# Patient Record
Sex: Female | Born: 1940 | Race: White | Hispanic: No | Marital: Married | Smoking: Never smoker
Health system: Southern US, Community
[De-identification: ages and names within clinical notes are randomized; demographics above are authoritative.]

## PROBLEM LIST (undated history)

## (undated) DIAGNOSIS — I639 Cerebral infarction, unspecified: Secondary | ICD-10-CM

## (undated) HISTORY — DX: Cerebral infarction, unspecified: I63.9

---

## 1999-01-16 ENCOUNTER — Other Ambulatory Visit: Admission: RE | Admit: 1999-01-16 | Discharge: 1999-01-16 | Payer: Self-pay | Admitting: *Deleted

## 2000-02-15 ENCOUNTER — Other Ambulatory Visit: Admission: RE | Admit: 2000-02-15 | Discharge: 2000-02-15 | Payer: Self-pay | Admitting: *Deleted

## 2002-06-19 ENCOUNTER — Inpatient Hospital Stay (HOSPITAL_COMMUNITY): Admission: EM | Admit: 2002-06-19 | Discharge: 2002-06-29 | Payer: Self-pay

## 2002-06-20 ENCOUNTER — Encounter: Payer: Self-pay | Admitting: Neurology

## 2002-06-21 ENCOUNTER — Encounter: Payer: Self-pay | Admitting: Neurology

## 2002-06-23 ENCOUNTER — Encounter: Payer: Self-pay | Admitting: Neurology

## 2002-06-29 ENCOUNTER — Inpatient Hospital Stay (HOSPITAL_COMMUNITY)
Admission: RE | Admit: 2002-06-29 | Discharge: 2002-08-03 | Payer: Self-pay | Admitting: Physical Medicine & Rehabilitation

## 2002-07-06 ENCOUNTER — Encounter: Payer: Self-pay | Admitting: Physical Medicine & Rehabilitation

## 2002-07-19 ENCOUNTER — Encounter: Payer: Self-pay | Admitting: Physical Medicine & Rehabilitation

## 2002-07-27 ENCOUNTER — Encounter: Payer: Self-pay | Admitting: Physical Medicine & Rehabilitation

## 2002-09-17 ENCOUNTER — Encounter: Payer: Self-pay | Admitting: Neurology

## 2002-09-17 ENCOUNTER — Ambulatory Visit (HOSPITAL_COMMUNITY): Admission: RE | Admit: 2002-09-17 | Discharge: 2002-09-17 | Payer: Self-pay | Admitting: Neurology

## 2002-11-10 ENCOUNTER — Encounter
Admission: RE | Admit: 2002-11-10 | Discharge: 2002-12-05 | Payer: Self-pay | Admitting: Physical Medicine & Rehabilitation

## 2002-12-06 ENCOUNTER — Encounter
Admission: RE | Admit: 2002-12-06 | Discharge: 2003-03-06 | Payer: Self-pay | Admitting: Physical Medicine & Rehabilitation

## 2002-12-21 ENCOUNTER — Encounter
Admission: RE | Admit: 2002-12-21 | Discharge: 2003-03-21 | Payer: Self-pay | Admitting: Physical Medicine & Rehabilitation

## 2003-10-03 ENCOUNTER — Encounter
Admission: RE | Admit: 2003-10-03 | Discharge: 2004-01-01 | Payer: Self-pay | Admitting: Physical Medicine & Rehabilitation

## 2003-11-07 ENCOUNTER — Encounter
Admission: RE | Admit: 2003-11-07 | Discharge: 2004-02-05 | Payer: Self-pay | Admitting: Physical Medicine & Rehabilitation

## 2004-02-06 ENCOUNTER — Encounter
Admission: RE | Admit: 2004-02-06 | Discharge: 2004-02-14 | Payer: Self-pay | Admitting: Physical Medicine & Rehabilitation

## 2004-09-22 IMAGING — XA IR ANGIO/CAROTID/CERV BI
1 series · 12 of 24 positions shown · IV contrast (omnipaque)
Comparison: none

FINDINGS
CLINICAL DATA: PATIENT WITH LEFT-SIDED CEREBRAL HEMORRHAGE.  ETIOLOGY UNKNOWN. EVALUATING FOR
ARTERIAL VENOUS MALFORMATION.
CAROTID AND CEREBRAL ARTERIOGRAMS
FOLLOWING A FULL EXPLANATION OF THE PROCEDURE, ALONG WITH THE POTENTIAL ASSOCIATED COMPLICATIONS,
AN INFORMED WITNESSED CONSENT WAS OBTAINED.
THE RIGHT GROIN WAS PREPPED AND DRAPED IN THE USUAL STERILE FASHION.  THEREAFTER USING THE MODIFIED
SELDINGER TECHNIQUE, TRANSFEMORAL ACCESS INTO THE RIGHT COMMON FEMORAL ARTERY WAS OBTAINED WITHOUT
ANY DIFFICULTY.  OVER A .035 INCH GUIDEWIRE, A 5 FRENCH PINNACLE SHEATH WAS INSERTED.
THROUGH THIS, AND ALSO OVER A .035 INCH GUIDEWIRE, A 5 FRENCH JB-I CATHETER WAS ADVANCED INTO THE
AORTIC ARCH REGION AND SELECTIVE CANNULATION ARTERIOGRAMS WERE PERFORMED OF THE RIGHT VERTEBRAL
ARTERY, THE RIGHT COMMON CAROTID  ARTERY, THE LEFT COMMON CAROTID ARTERY AND THE VERTEBRAL BODY.
THE PATIENT EXPERIENCED CRAMPING IN HER LEFT LEG TOWARDS THE OF THE PROCEDURE WHICH WAS TREATED
SUCCESSFULLY WITH 4 MG OF VALIUM IV.
MEDICATIONS UTILIZED:  VERSED 1 MG IV; FENTANYL 25 MCG IV.
CONTRAST UTILIZED:  OMNIPAQUE 300, APPROXIMATELY 80 CC.

[Series 1: run · 12 of 45 slices shown]
[im 2/45]
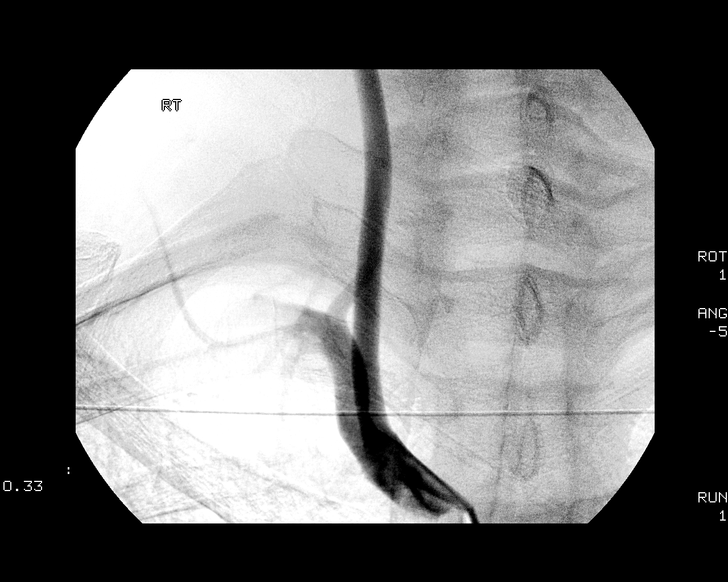
[im 6/45]
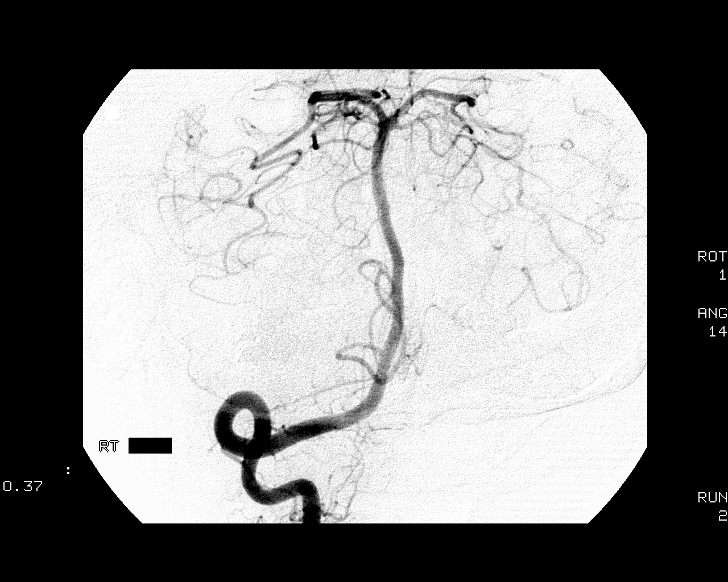
[im 10/45]
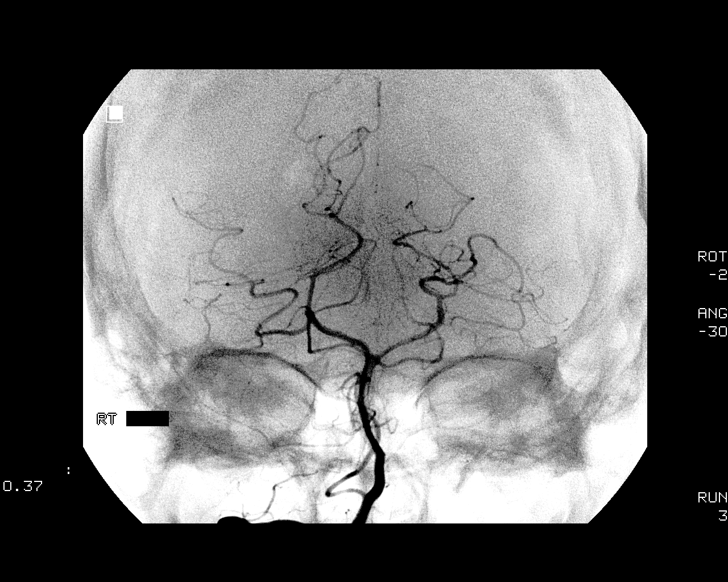
[im 14/45]
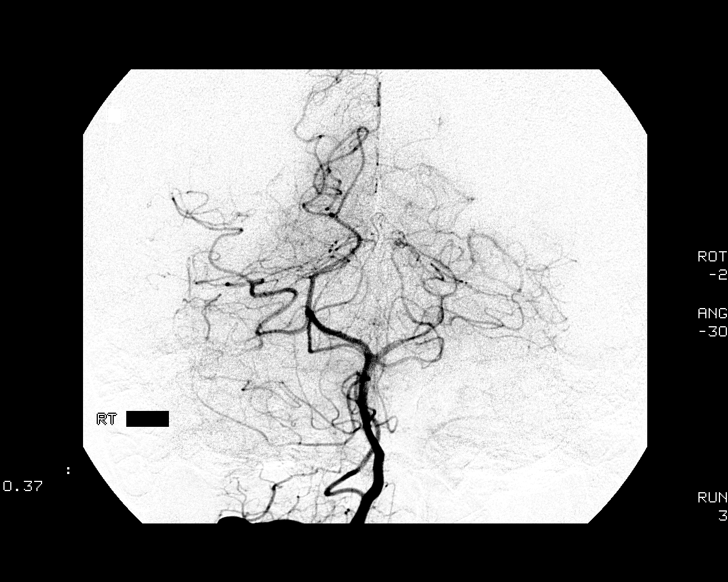
[im 18/45]
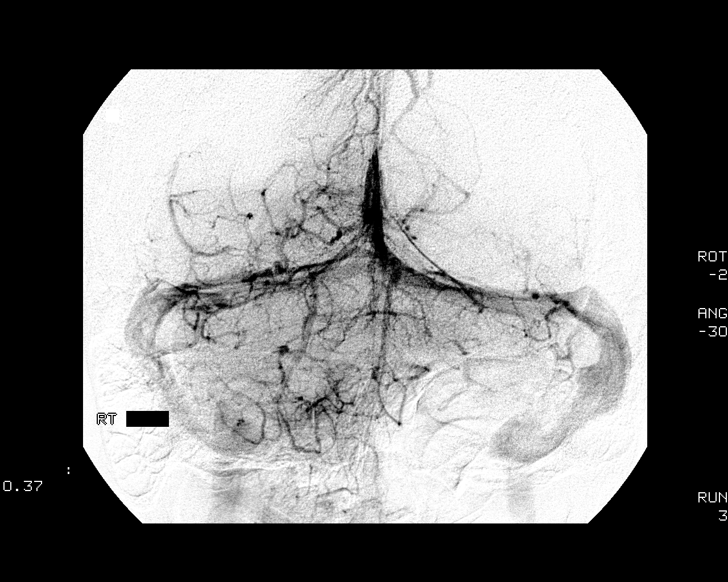
[im 22/45]
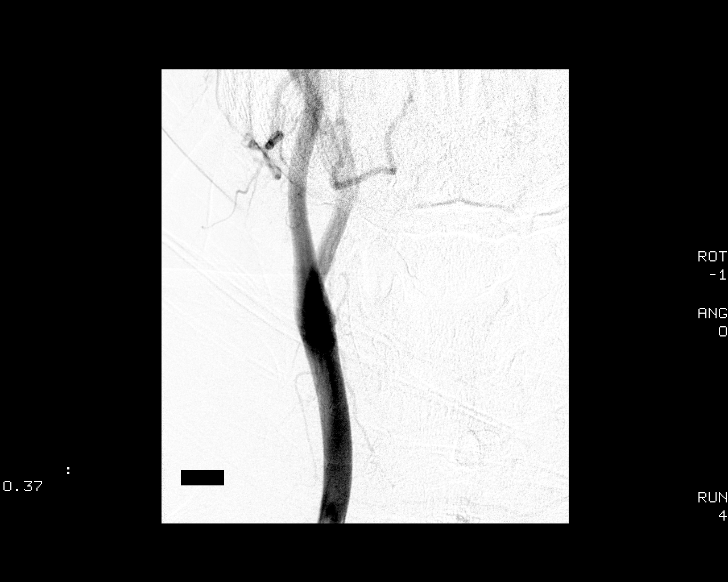
[im 25/45]
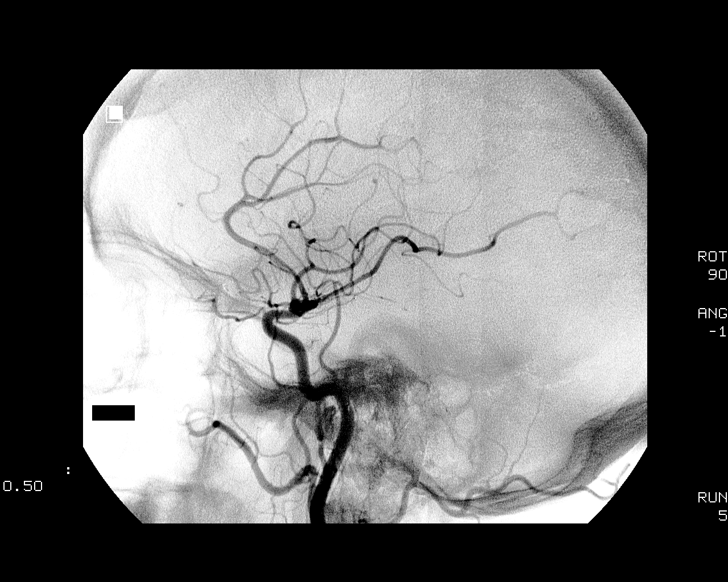
[im 29/45]
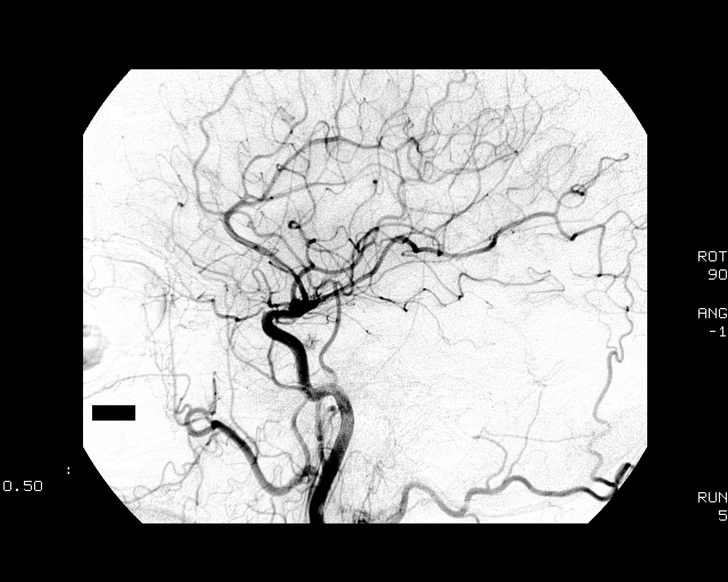
[im 33/45]
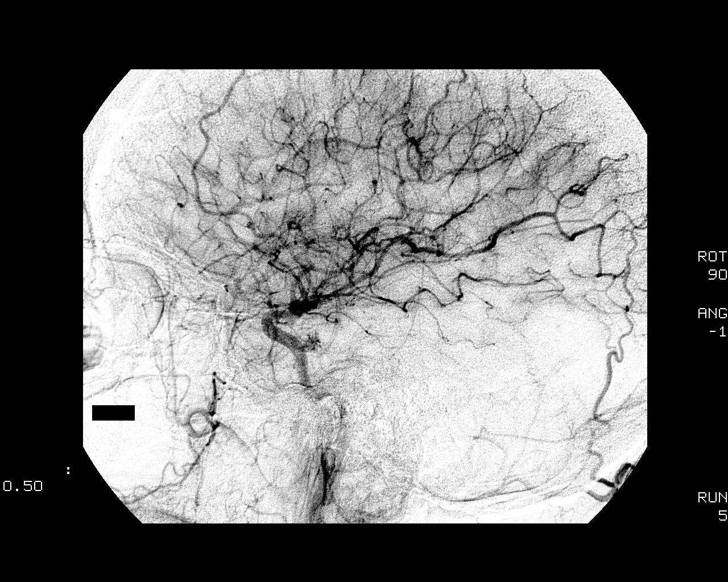
[im 37/45]
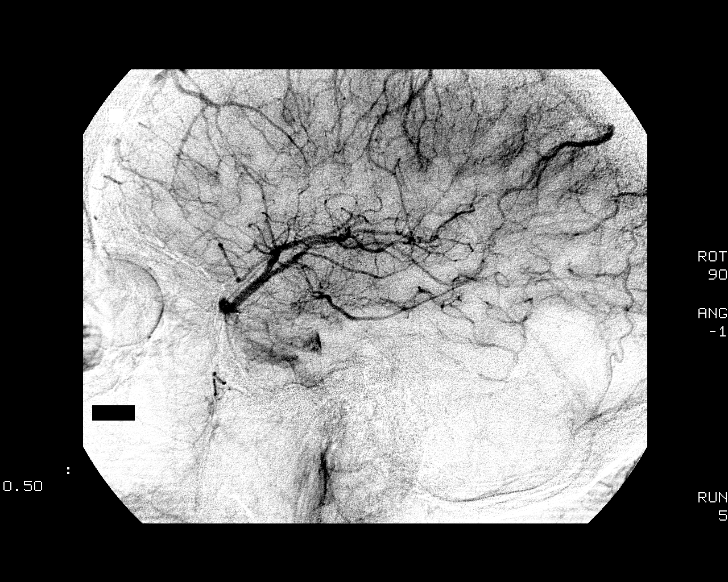
[im 41/45]
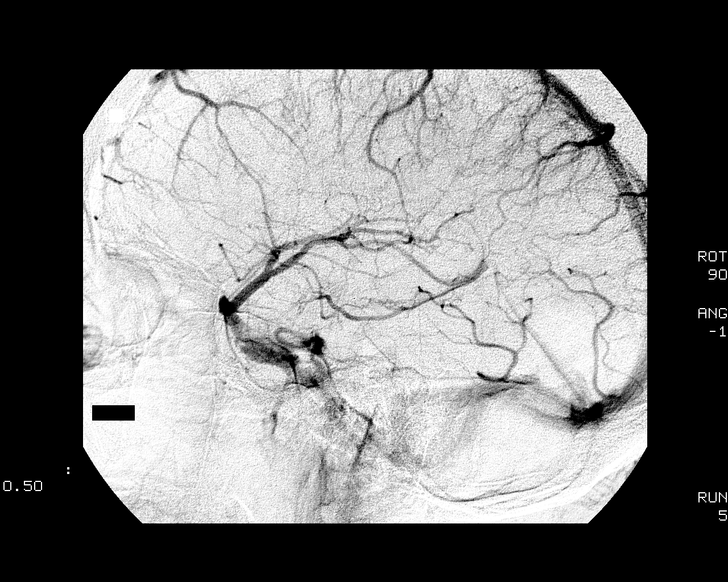
[im 45/45]
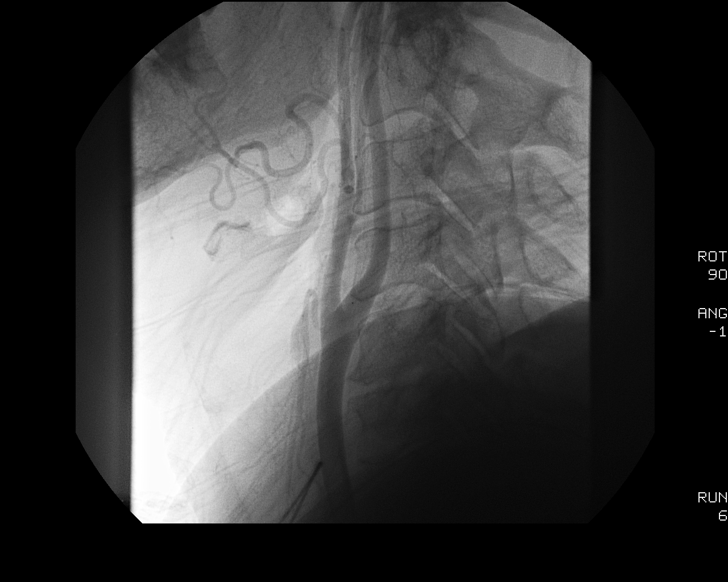

[12 of 24 positions shown; findings below may reference images not displayed]

FINDINGS: THE RIGHT VERTEBRAL  ARTERY  ORIGIN IS NORMAL.  THIS VESSEL ASCENDS NORMALLY INTO THE
CRANIAL SKULL BASE.  THERE IS NORMAL OPACIFICATION OF THE RIGHT POSTERIOR  INFERIOR CEREBELLAR
ARTERY, THE BASILAR ARTERY, THE POSTERIOR CEREBELLAR ARTERIES AND THE ANTERIOR/INFERIOR CEREBELLAR
ARTERIES INTO THE CAPILLARY AND VENOUS PHASES.
THE BASILAR ARTERY THAT OPACIFIES IS UNREMARKABLE.  THERE IS INFLOW OF UNOPACIFIED BLOOD FROM THE
CONTRALATERAL VERTEBRAL ARTERY IN THE BASILAR ARTERY.
THE RIGHT COMMON CAROTID ARTERIOGRAM  DEMONSTRATES THE RIGHT EXTERNAL CAROTID ARTERY AND BRANCHES
TO BE NORMAL.
THE RIGHT INTERNAL CAROTID ARTERY AT THE BULB END DISTALLY IS NORMAL.   THE RIGHT MIDDLE AND THE
RIGHT  ANTERIOR CEREBRAL ARTERIES OPACIFY NORMALLY INTO THE CAPILLARY AND VENOUS PHASES.
THERE IS  SLIGHT ELEVATION SUPERIORLY OF THE RIGHT M1 SEGMENT.
THE LEFT COMMON CAROTID ARTERIOGRAM DEMONSTRATES THE LEFT EXTERNAL CAROTID ARTERY ORIGIN AND
BRANCHES TO BE NORMAL.
THE LEFT INTERNAL CAROTID ARTERY AT THE BULB END DISTALLY IS NORMAL.  THE PETROUS, CAVERNOUS AND
THE  SUPRACLINOID SEGMENTS ARE NORMAL.
THE LEFT MIDDLE AND THE LEFT ANTERIOR CEREBRAL ARTERIES OPACIFY NORMALLY INTO THE CAPILLARY AND
VENOUS PHASES.
THE LEFT VERTEBRAL ARTERY ORIGIN IS NORMAL.  THE VESSEL ALSO ASCENDS NORMALLY INTO THE CRANIAL
SKULL BASE WHERE THERE IS NORMAL OPACIFICATION OF THE LEFT POSTERIOR INFERIOR CEREBELLAR ARTERY.
THE LEFT VERTEBRAL BASILAR JUNCTION AND THE OPACIFIED SEGMENTS OF THE BASILAR ARTERY ARE NORMAL.
ALSO THE OPACIFIED SEGMENTS OF THE POSTERIOR CEREBELLAR ARTERIES, THE SUPERIOR CEREBELLAR ARTERIES
AND THE ANTERIOR/INFERIOR CEREBELLAR ARTERIES ARE UNREMARKABLE.
IMPRESSION
NO ANGIOGRAPHIC EVIDENCE OF ARTERIAL VENOUS MALFORMATION, ANEURYSMS, INTRACRANIAL AND EXTRACRANIAL
DISSECTIONS, OCCLUSIONS, STENOSIS OR MEDIAL OR HYPERVASCULARITY OR DURAL ARTERIAL VENOUS
MALFORMATIONS.
ELEVATION WITH SUPERIOR DISPLACEMENT OF THE RIGHT A1 SEGMENT OF UNKNOWN SIGNIFICANCE. CORRELATION
WITH MRI SCAN MAY BE HELPFUL.

## 2005-02-12 ENCOUNTER — Other Ambulatory Visit: Admission: RE | Admit: 2005-02-12 | Discharge: 2005-02-12 | Payer: Self-pay | Admitting: *Deleted

## 2005-03-21 ENCOUNTER — Encounter: Admission: RE | Admit: 2005-03-21 | Discharge: 2005-04-30 | Payer: Self-pay | Admitting: *Deleted

## 2005-05-01 ENCOUNTER — Encounter: Admission: RE | Admit: 2005-05-01 | Discharge: 2005-05-29 | Payer: Self-pay | Admitting: *Deleted

## 2006-08-21 ENCOUNTER — Other Ambulatory Visit: Admission: RE | Admit: 2006-08-21 | Discharge: 2006-08-21 | Payer: Self-pay | Admitting: *Deleted

## 2007-08-28 ENCOUNTER — Encounter: Admission: RE | Admit: 2007-08-28 | Discharge: 2007-10-22 | Payer: Self-pay | Admitting: Family Medicine

## 2007-09-07 ENCOUNTER — Other Ambulatory Visit: Admission: RE | Admit: 2007-09-07 | Discharge: 2007-09-07 | Payer: Self-pay | Admitting: Obstetrics and Gynecology

## 2011-01-01 ENCOUNTER — Ambulatory Visit: Payer: Medicare Other | Attending: Obstetrics and Gynecology | Admitting: Physical Therapy

## 2011-01-01 DIAGNOSIS — M2420 Disorder of ligament, unspecified site: Secondary | ICD-10-CM | POA: Insufficient documentation

## 2011-01-01 DIAGNOSIS — M629 Disorder of muscle, unspecified: Secondary | ICD-10-CM | POA: Insufficient documentation

## 2011-01-01 DIAGNOSIS — M25659 Stiffness of unspecified hip, not elsewhere classified: Secondary | ICD-10-CM | POA: Insufficient documentation

## 2011-01-01 DIAGNOSIS — IMO0001 Reserved for inherently not codable concepts without codable children: Secondary | ICD-10-CM | POA: Insufficient documentation

## 2011-01-01 DIAGNOSIS — R209 Unspecified disturbances of skin sensation: Secondary | ICD-10-CM | POA: Insufficient documentation

## 2011-01-11 NOTE — Assessment & Plan Note (Signed)
[name] is back regarding her left basal ganglia hemorrhage and subsequent  right hemiparesis and speech/sensory deficits.  She denies any new problems  at home.  She has had some return of her strength in her right side since  her last visit.  She does have some concerns with her right ankle brace and  wonders if there is something less bulky that she can use to help her  ambulate.  She continues to use the orthosis the majority of the time around  the house and outside the house certainly.  She has completed all of her  therapies at the outpatient center.  She has been doing pretty well from the  standpoint of her speech.   REVIEW OF SYMPTOMS:  The patient denies any problems with chest pain,  shortness of breath, wheezing or coughing.  Does not report any anxiety or  depression.  She is sleeping pretty well.  She does note increased numbness  in the right arm and leg.  This leg is heavy at times.  There is no nausea,  vomiting, diarrhea, or constipation, bowel or bladder continence issues.  Denies any fever, chill or weight loss.   PHYSICAL EXAMINATION:  GENERAL APPEARANCE:  The patient is pleasant, in no  acute distress.  VITAL SIGNS:  Blood pressure 121/74, pulse 77, she is saturating 98% on room  air.  NEUROLOGIC:  The patient is alert and oriented x3.  She is speaking much  better than she has with several sentences and phrases as of today that were  intelligible.  She is answering appropriately to almost all my questions  with extra time and repetition.  Strength in the upper extremity is 1+  distally and 2+/5 proximally.  Right lower extremity is 3+ to 4/5 proximally  and 1+ to 2 distally.  Sensory examination was decreased on the right side.  The patient ambulates with AFO today and had some excessive knee bend to the  left.  She had fair foot clearance but tended to tilt toward the left side.  I almost felt that she walked better today without her AFO, although there  was  some problem with toe clearance without the brace.  Skin was intact.  Cranial nerve examination was unchanged.   ASSESSMENT:  1. 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  2. Aphasia.  3. Gait disorder.   PLAN:  1. Wonder if the patient would do better with an ASO which would be lower     profile but allow her more flexibility in her shoe wear.  If this is not     an option, we may try an over the ankle weight shoe.  I will send her for     an evaluation at [facility] today.  2. The patient will be in our spasticity program.  We will send her for     spasticity evaluation and set her up for Botox injection to the right     finger and wrist flexors.  We will use 2 units of Botox.  3. Will see her back pending her injections.  Overall, Mrs. Roscoe has made     great progress.      Ranelle Oyster, M.D.   ZTS/MedQ  D:  10/04/2003 14:07:08  T:  10/04/2003 14:41:19  Job #:  [id number]   cc:   Al Decant. Janey Greaser, MD  [address]  [place]  [place] [address]  Fax: [phone]

## 2011-01-11 NOTE — H&P (Signed)
NAME:  [name], [name]                      ACCOUNT NO.:  [id number]   MEDICAL RECORD NO.:  [id number]                   PATIENT TYPE:  EMS   LOCATION:  [facility]                                 FACILITY:  [facility]   PHYSICIAN:  Marlan Palau, M.D.               DATE OF BIRTH:  April 27, 1941   DATE OF ADMISSION:  06/19/2002  DATE OF DISCHARGE:                                HISTORY & PHYSICAL   HISTORY OF PRESENT ILLNESS:  The patient is a 70 year old right-handed white  female born 03/15/1941 without significant past medical history.  This patient  was on no medications prior to this admission.  Comes in after an event that  occurred at 11:30 a.m. today while at church.  The patient  was making  announcements and suddenly developed some problems with right-sided  weakness, involuntary movements of the right side and decreased response  from this and decreased verbal output.  This patient  was brought into the  emergency room on an urgent basis.  CT scan of the brain showed evidence of  a left basal gangliar intracranial hemorrhage with a right hemiparesis, left  gaze preference and global aphasia. Neurology was asked to see this patient  for further evaluation.  This patient  is not severely hypertensive and has  no significant history of hypertension in the past.   PAST MEDICAL HISTORY:  Notable for one new onset of left basal gangliar  intracranial hemorrhage, history of migraine.  The patient  is on no  medications.  Has no known allergies.  Does not smoke or drink.   SOCIAL HISTORY:  The patient lives in the [place] area.  Married.  Has  two daughters.  One is adopted.  One is natural daughter.  The patient  is  not employed.   FAMILY MEDICAL HISTORY:  Notable for fact that mother died with organic  brain syndrome MI.  Father is alive.  Does live in a rest home currently.  The patient  has six brothers and sisters. One  died with diabetes.  Sister  has breast cancer.   REVIEW OF  SYSTEMS:  Cannot be obtained at this time.  The patient  was  complaining of feeling tired and up late last night with another sick family  member. Otherwise, this patient  did not have significant problems with  numbness, weakness, headache, problems with visual changes prior to this  admission.   PHYSICAL EXAMINATION:  VITALS:  Blood pressure  160/74, heart rate  76,  respiratory rate 18.  The patient  is sleepy, but can be alerted during the  examination.  HEENT:  Head is atraumatic.  Eyes:  Pupils are equal, round and reactive to  light.  Discs soft, flat bilaterally.  Left gaze preference noted.  The  patient  is able to get the eyes to the midline.  NECK:  Supple. No carotid bruits noted.  RESPIRATORY:  Clear.  CARDIOVASCULAR:  Reveals a regular rate and rhythm without obvious murmurs  or rubs noted.  ABDOMEN:  Positive bowel sounds.  No organomegaly or tenderness noted.  EXTREMITIES:  Without significant edema.  NEUROLOGIC:  Cranial nerves as above.  Facial symmetry was not present but a  minimal depression of the right nasal labial fold.  The patient  does have  left gaze preference, is not verbal.  The patient  will blink to threats in  the left, not from the right.  Motor testing is difficult to ascertain.  The  patient  really does not follow commands at all.  Will not  elevate either  arm above her head.  Will not grip to command.  The patient  will  spontaneously move both sides to some degree.  The right arm less well than  the right leg.  The left side is moving better than the right.  The patient  appears to have an upgoing toe on the right, not on the left.  The patient  will withdrawal to pain on the left arm and the left leg, not as well on the  right side.  Deep tendon reflexes are somewhat depressed throughout.  Upgoing toe on the right.  The patient  will not follow commands for  cerebellar testing.  Could not be ambulated.   Laboratory values are pending at this  time including CBC, comprehensive  metabolic profile, urinalysis, PT, PTT.   Chest x-ray and EKG are pending.   IMPRESSION:  Left basal gangliar intracranial hemorrhage.  This patient has  a moderate sized intracranial hemorrhage on the left.  The patient does have  a significant deficit from this with right hemiparesis, left gaze  preference.  Prognosis at this time is guarded.  I will admit this patient  to ICU and manage blood pressures and give supportive care.   PLAN:  1. Admission to 3100 unit.  2. Admission blood work.  3. Blood pressure  management.  4. Repeat CT scan in a.m.   I will follow patient  clinically while in house.                                               Marlan Palau, M.D.    CKW/MEDQ  D:  06/19/2002  T:  06/21/2002  Job:  [id number]   cc:   Al Decant. Janey Greaser, M.D.  [address]  [place]  [place] [address]  Fax: [phone]   [facility]

## 2011-01-11 NOTE — Discharge Summary (Signed)
NAME:  Amber [name], Amber [name]                      ACCOUNT NO.:  [id number]   MEDICAL RECORD NO.:  [id number]                   PATIENT TYPE:  IPS   LOCATION:  4031                                 FACILITY:  [facility]   PHYSICIAN:  Ranelle Oyster, M.D.             DATE OF BIRTH:  1941-08-16   DATE OF ADMISSION:  DATE OF DISCHARGE:  08/03/2002                                 DISCHARGE SUMMARY   DISCHARGE DIAGNOSES:  1. Left basogangliar hemorrhage.  2. Urinary tract infection.  3. Hypertension.   HISTORY OF PRESENT ILLNESS:  The patient is [name] 70 year old right-handed white  female with past history unremarkable who presented on 06/19/02 with right-  sided weakness and decreased verbal output.  Head CT revealed left  basogangliar hemorrhage.  Consulted by neurology, Dr. Lesia Sago.  Physical therapy report indicated patient was out of bed, mod assist with  transfers, supine to sit mod assist. Patient is presently nonambulatory,  presents on dysphagia 1 honey-thick liquid diet.   HOSPITAL COURSE:  Significant for elevated blood pressure, hyperkalemia,  constipation, UTI.  Recommend repeat MRI within 3-4 weeks and cerebral  angiogram to define source of hemorrhage in 3-4 weeks, per neurology.  The  patient was transferred to [facility] Department on 06/29/02.   PAST MEDICAL HISTORY:  Unremarkable.   PAST SURGICAL HISTORY:  None.   MEDICATIONS PRIOR TO ADMISSION:  None.   PRIMARY CARE PHYSICIAN:  Dr. Doran Clay.   ALLERGIES:  DAIRY PRODUCTS.   FAMILY HISTORY:  Mother deceased, organic brain syndrome. Father living in  skilled nursing facility.   REVIEW OF SYSTEMS:  Significant for headache. No chest pain, nausea and  vomiting, shortness of breath.   SOCIAL HISTORY:  Patient lives with husband in [name] 1 level home in [place],  was independent prior to admission. Has 1-2 steps entry. Denies any tobacco  or alcohol use. She works at [employer], she has 2  children, 1  daughter, 1 son.  Husband able to assist some, family and friends to assist.   REHABILITATION HOSPITAL COURSE:  The patient was admitted to [facility]  Rehabilitation Department on 06/29/02 for comprehensive rehabilitation.  Received more than 3 hours of therapy daily.  Hospital course significant  for the following:   Diagnoses #1 - LEFT BASOGANGLIAR HEMORRHAGE.  Overall, the patient made fair  progress while in rehabilitation.  She got onto [name] very slow start.  Her  verbal skills quickly improved before her motor skills. She was able to  tolerate therapy very well.  During the first few days of therapy, she had  significant complaints of headaches and occasional pelvic pain.  The patient  received Vicodin and Tylenol as needed for headache.  During her rehab stay,  her headache did resolve and patient began to progress. Also, she had  occasional nausea and vomiting. She received Phenergan 12.5 mg and 25 mg as  needed.  At time  of admission, she was initially placed on [name] dysphagia 1  diet, honey-thick liquids. She was able to tolerate speech therapy, and her  diet was eventually advanced to [name] regular diet, dysphagia 3 diet with thin  liquids on 07/19/02.  Neurology followed patient.  They requested CT scan,  performed 07/27/02, demonstrated interval contractions in size of left  hemispheric intracerebral hematoma and significant interval decrease in  surrounding edema and mass effect. The area of the hemorrhage is now iso-  dense in respect to the adjacent brain. The remainder of the study is  unchanged.  During the first 3 weeks of her rehab stay, her right side was  flaccid. She received [name] right PRAFO for the right lower extremity by [employer]  as well as [name] Dynamic AFO for the right lower extremity and resting hand  orthosis on 07/27/02.  The patient continued in rehab, to develop motor  skills in the right lower extremity.  Patient went out on [name] day pass on  Saturday and Sunday  with family on 07/31/02, day pass went very well.  It was  felt best patient could be discharged to [name] skilled nursing facility, since  the patient would probably need 24 hours supervision care.  Latest PT  report:  Patient still requires total assist from squat to pivot and total  max assist for out of bed mobility. She was still unable to totally ambulate  but mobile by wheelchair.  It was thought that the patient's UTI was causing  pelvic pain and she was started on antibiotic.   Diagnoses #2 - HYPERTENSION. Throughout rehabilitation, blood pressure  remainder under fair control. She remained on clonidine 0.1 mg 24 TTS patch  every 7 days, no adjustments necessary in her blood pressure medication.   Diagnoses #3 - URINARY TRACT INFECTION.  The patient had several UTI's  during her rehabilitation hospital stay.  Latest urine cultures performed on  12/4, grew 100,000 colonies of E. coli. She was started on amoxicillin 250  mg p.o. t.i.d. on 12/6 for approximately 7 days.   Diagnoses #4 - FLUIDS, ELECTROLYTES, AND NUTRITION.  As stated above,  patient was initially on [name] dysphagia 1 diet, honey-thick liquids, and  received IV supplementation of 1/2 normal saline 50 cc/h, 7 p.m. to 7 [name].m.  Eventually, her diet was upgraded to dysphagia 2 diet on 07/06/02, honey-  thick liquids.  She was on [name] water protocol on 07/08/02.  On 11/24, she  finally had dysphagia 3 diet with thin liquids with full supervision and  chin tucks.  The patient tolerated advancement of diet very well.   Diagnoses #5 - PELVIC PAIN.  As stated above and in #1 as stated above,  patient had pelvic pain occasionally.  On 11/12, it was thought this was to  be caused by the Foley so Foley was discontinued. She had voiding trials,  received Pyridium 1 mg b.i.d. x3 days.  Also, she was started on Macrodantin  40 mg q.i.d. at time of admission for UTI for [name] total of 7 days.  The patient had another urine culture performed during  rehab which did show E.  coli and she was started on amoxicillin 12/6.   No other major medical problems occurred in rehab, except for occasional  headache.   LABORATORY DATA:  Latest hemoglobin was 13.3, hematocrit 37.2, white blood  cell count 9.5, platelet count 340. Sodium 139, potassium 3.9, chloride 107,  CO2 22, glucose 100, BUN 20, creatinine 0.7, prealbumin 24.1.  SUMMARY:  At the time of discharge, patient was bed mobility max assist,  total assist on transfers.  Regarding diet, she was on dysphagia 3 diet,  thin liquids, intermittent supervision with meals.  Uses wheelchair and  walker.  Neurologically, patient has significantly improved in speech, has  minimal aphasia.  Right upper extremity muscle strength is about 2-3/5,  right lower extremity strength about 2-3/5.   DISCHARGE MEDICATIONS:  1. Clonidine 0.1 mg patch q.d. every 7 days.  2. Multivitamins 1 tablet daily.  3. Amoxicillin 250 mg p.o. t.i.d. until 12/13.  4. Ensure pudding p.o. t.i.d.  5. Tylenol 250 p.o. q.4-6h. p.r.n.  6. Laxative of choice and enema of choice.  7. Sorbitol p.r.n.  8. Desyrel 50 mg p.o. q.h.s. p.r.n.   DISPOSITION:  The patient will follow up with Dr. Riley Kill within 6 weeks,  date to be arranged.  She is to follow up with her primary care physician,  Dr. Doran Clay within 6 weeks, and follow up with Dr. Lesia Sago  within 3 weeks or possibly sooner for cerebral angiogram in the future.         Junie Bame, P.[name].                       Ranelle Oyster, M.D.    LH/MEDQ  D:  08/02/2002  T:  08/02/2002  Job:  [id number]   cc:   Al Decant. Janey Greaser, M.D.  [address]  [place]  [place] [address]  Fax: [phone]   C. Lesia Sago, M.D.  [address]  [address]  [place]  [place] [address]  Fax: [phone]

## 2011-01-11 NOTE — Discharge Summary (Signed)
NAME:  Amber [name], Amber [name]                      ACCOUNT NO.:  [id number]   MEDICAL RECORD NO.:  [id number]                   PATIENT TYPE:  INP   LOCATION:  3015                                 FACILITY:  [facility]   PHYSICIAN:  Marlan Palau, M.D.               DATE OF BIRTH:  1941/05/08   DATE OF ADMISSION:  06/19/2002  DATE OF DISCHARGE:  06/29/2002                                 DISCHARGE SUMMARY   ADMISSION DIAGNOSES:  1. 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  2. History of migraine.   DISCHARGE DIAGNOSES:  1. Onset of right hemiparesis with global aphasia secondary to [name] left basal     ganglia intracranial hemorrhage.  2. History of migraine.   PROCEDURES:  CT of the head on multiple occasions.   COMPLICATIONS:  None.   HISTORY OF PRESENT ILLNESS:  The patient is [name] 70 year old right-handed white  female born 01/22/1941 with [name] history of no significant medical problems.  The patient was in church on the day of admission and was giving [name] speech.  The patient had onset of speech disturbance and right-sided weakness during  the speech.  The patient tended to fall but was caught by other church  members and was brought to the emergency room for an evaluation.  [name] CT scan  of the brain showed [name] large left basal ganglia intracranial hemorrhage with  [name] right hemiparesis noted by examination, left gaze preference, global  aphasia.  The patient was not hypertensive prior to admission.   PAST MEDICAL HISTORY:  1. New onset left basal ganglia intracranial hemorrhage.  2. History of migraines.   MEDICATIONS:  The patient was on no medications prior to admission.   ALLERGIES:  No known allergies.   HABITS:  She does not smoke or drink.   Please refer to history and physical dictation summary for social history,  family history, review of systems, and physical examination.   LABORATORY DATA:  Laboratory values are  notable for urine culture positive  for E. coli sensitive to Cipro and nitrofurantoin, Bactrim.  Sodium 132,  potassium 4.5, chloride 102, CO2 24, glucose 153, BUN 23, creatinine 0.8,  calcium 9.0.  White count 10.5, hemoglobin 13.1, hematocrit 37.5, MCV 92.8,  platelets 240.  INR 1.0.  Total protein 6.7, albumin 3.6, AST 21, ALT 16,  ALP 70, total bilirubin 0.1.  Urinalysis reveals [name] specific gravity of  1.017, pH of 7.5, 11-20 white cells, and 0-2 red cells.   EKG reveals normal sinus rhythm with sinus arrhythmia and heart rate of 72.  Chest x-ray shows minimal bibasilar atelectasis.  CT of the head shows  evidence of left basal ganglia hemorrhage with some low attenuation  surrounding this.  No intraventricular extension was noted.   HOSPITAL COURSE:  The patient was initially admitted to the neuroscience  intensive care unit for observation.  The patient had daily CT scans for  three days showing no extension into the intraventricular space.  This  patient initially was somewhat drowsy but seemed to chronically improve with  an increased level of alertness.  The patient required NG tube feedings  initially and continued to have [name] significant right hemiparesis.  The left  gaze preference seemed to improve over time.  The patient remained  essentially mute.  Physical, occupational, and speech therapy saw and  evaluated this patient.  Following speech therapy evaluation, it was felt  that the patient could resume eating and was placed on [name] Dysphagia I diet.  This patient has had blood pressures that have been well maintained  throughout the course of hospitalization.  The patient was seen by  rehabilitation consult team and the patient was felt to be an adequate  candidate for rehab.  The patient was treated with [name] clonidine patch TTS-1  and has done well again with her blood pressures.  The patient was  transferred to rehab on June 29, 2002 and will continue her therapy in  the  hospital.  At the time of discharge, the patient was bright, alert, and  was almost mute.  She will attempt to verbalize some phrases, has dense  right hemiparesis, [name] right homonymous visual field deficit.  She has ability  to follow some pure verbal commands occasionally but this is not consistent.  There may possibly be some perseveration involved with this as well.  The  patient is eating and drinking well.  Neurology will follow this patient  while in the hospital.  It is recommended that the patient have an MRI of  the brain in 3-4 weeks and would consider [name] cerebral angiogram around that  time as well, once the hematoma resolves.  The purpose of this is to look  for [name] source of hemorrhage in [name] patient that is not hypertensive.  We need  to rule out [name] cavernous angioma or AVM.                                               Marlan Palau, M.D.    CKW/MEDQ  D:  07/01/2002  T:  07/01/2002  Job:  [id number]   cc:   Al Decant. Janey Greaser, M.D.  [address]  [place]  [place] [address]  Fax: [phone]

## 2011-01-17 ENCOUNTER — Encounter: Payer: Self-pay | Admitting: Physical Therapy

## 2011-01-24 ENCOUNTER — Ambulatory Visit: Payer: Medicare Other | Admitting: Physical Therapy

## 2011-01-29 ENCOUNTER — Ambulatory Visit: Payer: Medicare Other | Attending: Obstetrics and Gynecology | Admitting: Physical Therapy

## 2011-01-29 DIAGNOSIS — M2420 Disorder of ligament, unspecified site: Secondary | ICD-10-CM | POA: Insufficient documentation

## 2011-01-29 DIAGNOSIS — M25659 Stiffness of unspecified hip, not elsewhere classified: Secondary | ICD-10-CM | POA: Insufficient documentation

## 2011-01-29 DIAGNOSIS — IMO0001 Reserved for inherently not codable concepts without codable children: Secondary | ICD-10-CM | POA: Insufficient documentation

## 2011-01-29 DIAGNOSIS — R209 Unspecified disturbances of skin sensation: Secondary | ICD-10-CM | POA: Insufficient documentation

## 2011-01-29 DIAGNOSIS — M629 Disorder of muscle, unspecified: Secondary | ICD-10-CM | POA: Insufficient documentation

## 2011-02-05 ENCOUNTER — Encounter: Payer: Self-pay | Admitting: Physical Therapy

## 2011-02-07 ENCOUNTER — Ambulatory Visit: Payer: Medicare Other | Admitting: Physical Therapy

## 2011-02-12 ENCOUNTER — Encounter: Payer: Self-pay | Admitting: Physical Therapy

## 2011-02-14 ENCOUNTER — Encounter: Payer: Self-pay | Admitting: Physical Therapy

## 2011-02-21 ENCOUNTER — Ambulatory Visit: Payer: Medicare Other | Admitting: Physical Therapy

## 2011-02-21 ENCOUNTER — Encounter: Payer: Self-pay | Admitting: Physical Therapy

## 2011-03-07 ENCOUNTER — Encounter: Payer: Medicare Other | Admitting: Physical Therapy

## 2012-11-16 ENCOUNTER — Encounter: Payer: Self-pay | Admitting: Family Medicine

## 2012-11-16 DIAGNOSIS — I6930 Unspecified sequelae of cerebral infarction: Secondary | ICD-10-CM | POA: Insufficient documentation

## 2012-11-16 DIAGNOSIS — E7800 Pure hypercholesterolemia, unspecified: Secondary | ICD-10-CM | POA: Insufficient documentation

## 2012-11-16 DIAGNOSIS — G43009 Migraine without aura, not intractable, without status migrainosus: Secondary | ICD-10-CM | POA: Insufficient documentation

## 2012-11-16 DIAGNOSIS — M8580 Other specified disorders of bone density and structure, unspecified site: Secondary | ICD-10-CM | POA: Insufficient documentation

## 2012-11-16 DIAGNOSIS — I10 Essential (primary) hypertension: Secondary | ICD-10-CM | POA: Insufficient documentation

## 2012-11-17 ENCOUNTER — Ambulatory Visit (INDEPENDENT_AMBULATORY_CARE_PROVIDER_SITE_OTHER): Payer: Medicare Other | Admitting: Family Medicine

## 2012-11-17 ENCOUNTER — Encounter: Payer: Self-pay | Admitting: Family Medicine

## 2012-11-17 VITALS — BP 142/84 | HR 83 | Ht 65.5 in | Wt 206.8 lb

## 2012-11-17 DIAGNOSIS — R51 Headache: Secondary | ICD-10-CM

## 2012-11-17 DIAGNOSIS — I10 Essential (primary) hypertension: Secondary | ICD-10-CM

## 2012-11-17 DIAGNOSIS — I6990 Unspecified sequelae of unspecified cerebrovascular disease: Secondary | ICD-10-CM

## 2012-11-17 DIAGNOSIS — I6930 Unspecified sequelae of cerebral infarction: Secondary | ICD-10-CM

## 2012-11-17 MED ORDER — ENALAPRIL MALEATE 10 MG PO TABS: 10.0000 mg | ORAL_TABLET | Freq: Every day | ORAL | Status: DC

## 2012-11-17 MED ORDER — PROPRANOLOL HCL 20 MG PO TABS: 20.0000 mg | ORAL_TABLET | Freq: Three times a day (TID) | ORAL | Status: DC

## 2012-11-17 MED ORDER — SIMVASTATIN 40 MG PO TABS: 40.0000 mg | ORAL_TABLET | Freq: Every day | ORAL | Status: DC

## 2012-11-17 NOTE — Patient Instructions (Addendum)
It was a pleasure to see you today.  Your blood pressure is mildly elevated today.   For the headaches and blood pressure, I would like to ADD a medicine called PROPRANOLOL 20mg  tablets, take 1 tablet THREE TIMES A DAY.  This will help control your blood pressure better, and to diminish the frequency and intensity of the headaches.   I would like to see you back in the coming 4 to 6 weeks for follow up.  PLEASE COME FASTING FOR THE NEXT VISIT TO CHECK YOUR LABS.  If the new medicine is not working out, then stop the medicine and give me a call.

## 2012-11-18 NOTE — Assessment & Plan Note (Signed)
Headaches of longstanding duration, with previous workup and consult with Neurology.  Peculiar presentation of headaches only on Mondays.  Depakote has not helped in the past for empiric treatment of presumed migraine.  Will attempt to prophylax with propranolol, which ideally will also help with blood pressure control as well. To review headache history at follow up in the coming 4-6 weeks.  May consider SSRI for headache prophylaxis if no benefit with BB.

## 2012-11-18 NOTE — Progress Notes (Signed)
  Subjective:    Patient ID: [name], female    DOB: 10/01/1940, 72 y.o.   MRN: [id number]  HPI Patient here as new patient.  Accompanied by her husband, who is an established patient of mine.  Xoie's previous primary physician was Dr. Juluis Rainier in [place].  She has been followed by Dr. Anne Hahn (Neuro) for longstanding headache complaint, which is what she most wishes to address today at her establishment visit.   PMHx: Old records from Dr Anne Hahn and Dr Zachery Dauer' offices reviewed in advance of this visit.  She has significant for history of L intracranial bleed and R sided residual weakness that dates back to 2003.    Patient wishes to address headaches.  She reports severe headaches that focus on the right side of head, come on only on Mondays. Cries, goes to lay on lounge chair/sofa and wait for headache to pass. Starts each Monday around 8am, lasts to 4pm.  Past workup according to old records consisted of EEG and MRI brain; she has been on Depakote for diagnosis of migraines, which did not have an effect on the headache per patient/ husband.  The headaches come on irrespective of geographic location (even when visiting family in [place]) or circumstance.  Patient does not work or have changes in environmental exposures on weekends that would predispose her to headaches on Mondays.    Review of Systems Denies chest pain or shortness of breath. 11-point ROS completed by patient, only positive is sexual difficulties.    Social Hx; no alcohol or drug use.  Never-smoker. Feels safe in relationships.  Primary caregiver is her husband, Richard Cody.     Objective:   Physical Exam Generally well appearing, no apparent distress HEENT Neck supple. Cervical adenopathy.  Moist mucus membranes. PERRL. COR Regular S1S2, no extra sounds. PULM Clear bilaterally, no rales or wheezes.  NEURO R handgrip weakness       Assessment & Plan:

## 2012-11-18 NOTE — Assessment & Plan Note (Signed)
Addition of BB (propranolol) for headache prophylaxis as well as improvement in his blood pressure control. Patient to avoid use of antiplatelet therapy due to history of intracerebral hemorrhage in the past.  Reassess BP in the coming 4- to 6 weeks.

## 2012-12-29 ENCOUNTER — Encounter: Payer: Self-pay | Admitting: Family Medicine

## 2012-12-29 ENCOUNTER — Ambulatory Visit (INDEPENDENT_AMBULATORY_CARE_PROVIDER_SITE_OTHER): Payer: Medicare Other | Admitting: Family Medicine

## 2012-12-29 VITALS — BP 140/80 | HR 64 | Ht 65.5 in | Wt 205.4 lb

## 2012-12-29 DIAGNOSIS — I6990 Unspecified sequelae of unspecified cerebrovascular disease: Secondary | ICD-10-CM

## 2012-12-29 DIAGNOSIS — R51 Headache: Secondary | ICD-10-CM

## 2012-12-29 DIAGNOSIS — I10 Essential (primary) hypertension: Secondary | ICD-10-CM

## 2012-12-29 DIAGNOSIS — I6930 Unspecified sequelae of cerebral infarction: Secondary | ICD-10-CM

## 2012-12-29 LAB — COMPREHENSIVE METABOLIC PANEL
ALT: 17 U/L (ref 0–35)
AST: 18 U/L (ref 0–37)
Albumin: 4.1 g/dL (ref 3.5–5.2)
BUN: 11 mg/dL (ref 6–23)
Calcium: 9 mg/dL (ref 8.4–10.5)
Chloride: 105 mEq/L (ref 96–112)
Potassium: 4.8 mEq/L (ref 3.5–5.3)
Sodium: 140 mEq/L (ref 135–145)
Total Protein: 7.1 g/dL (ref 6.0–8.3)

## 2012-12-29 NOTE — Assessment & Plan Note (Signed)
SBP by my read today is 140.  To continue current therapy.

## 2012-12-29 NOTE — Assessment & Plan Note (Signed)
Continues to have weekly severe headaches on Mondays; no clear etiology identified, although it is interesting to note that the BB helps the daily HA symptoms but not the weekly ones.  When offered options: 1) keep meds the same as they are; 2) addition of an SNRI (such as Cymbalta) for additional headache prophylaxis, patient prefers to keep meds as they are now (ie, does not want addition of a new medication for HA).

## 2012-12-29 NOTE — Patient Instructions (Addendum)
It was a pleasure to see you today.   We will keep your medications the same as they are now; reassess at your next visit in 2 to 3 months.   Labs today; If normal, I will send a letter.  If there is anything we need to address more urgently, I will call.  FOLLOW UP WITH DR Mauricio Po IN 2 to 3 MONTHS>

## 2012-12-29 NOTE — Progress Notes (Signed)
  Subjective:    Patient ID: [name], female    DOB: Jan 21, 1941, 72 y.o.   MRN: [id number]  HPI Here to follow up on recurrent headaches, and blood pressure.  Since starting the labetalol she notes that her weekly "bad" headaches (always on Mondays) are unchanged, but is using less daily tylenol (by about 1 tab/day less) for chronic low-level headaches.  Husband is present today and substantiates this observation.  Her HAs on Mondays start after breakfast (@7 -8am) and are severe until 5pm.  The right side of her face "draws up" with the pain.  She denies much in the way of nausea with the HA pain.    Regarding BP, has not noted change in energy level since starting BB.  No chest pain or dyspnea.    Review of Systems See HPI.     Objective:   Physical Exam Well appearing, no apparent distress HEENT Neck supple.  COR Reg S1S2 pulse 64 and regular by my count.  PULM Clear bilaterally. NEURO: Does not extend RUE; uses L hand to manipulate the right arm.       Assessment & Plan:

## 2012-12-30 ENCOUNTER — Encounter: Payer: Self-pay | Admitting: Family Medicine

## 2013-05-18 ENCOUNTER — Encounter: Payer: Self-pay | Admitting: Family Medicine

## 2013-05-18 ENCOUNTER — Ambulatory Visit (INDEPENDENT_AMBULATORY_CARE_PROVIDER_SITE_OTHER): Payer: Medicare Other | Admitting: Family Medicine

## 2013-05-18 VITALS — BP 135/79 | HR 65 | Ht 65.5 in | Wt 208.0 lb

## 2013-05-18 DIAGNOSIS — Z23 Encounter for immunization: Secondary | ICD-10-CM

## 2013-05-18 DIAGNOSIS — R51 Headache: Secondary | ICD-10-CM

## 2013-05-18 MED ORDER — SIMVASTATIN 40 MG PO TABS: 40.0000 mg | ORAL_TABLET | Freq: Every day | ORAL | Status: DC

## 2013-05-18 MED ORDER — PROPRANOLOL HCL 20 MG PO TABS: 20.0000 mg | ORAL_TABLET | Freq: Three times a day (TID) | ORAL | Status: DC

## 2013-05-18 MED ORDER — ENALAPRIL MALEATE 10 MG PO TABS: 10.0000 mg | ORAL_TABLET | Freq: Every day | ORAL | Status: DC

## 2013-05-18 NOTE — Assessment & Plan Note (Addendum)
Improved since starting Inderal.  Given her pulse rate today, will not increase dose of BB.  Discussed whether she would want to try other regimens to control headaches on Mondays; she feels that things are going relatively well and prefers to continue with current treatment plan.  To review/revise at follow up in 4 months.

## 2013-05-18 NOTE — Progress Notes (Signed)
  Subjective:    Patient ID: [name], female    DOB: 1941-04-21, 72 y.o.   MRN: [id number]  HPI  Patient here for follow up of HAs.  Husband Gerlene Burdock is also in the room. [name] Monday HAs still happen without fail; they resolve spontaneously at around 5pm.  Tylenol seems to help a little.  Tries to sleep most of the day on Mondays.    Since starting the Inderal, she has noted an improvement in the frequency and the intensity of HA symptoms on other days.  She previously had frequent dizzy spells, but has had this on only one occasion since she saw me last, 4 months ago.   She takes Tylenol 1000mg , one tablet every evening, and 1 tablet in the morning on about half of the days of the week.  No other meds for headache.     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   She denies HA at the time of this interview.    Objective:   Physical Exam Well appearing. Smiling, giving detailed history, in no apparent distress HEENT Neck supple. EOMI. No facial asymmetry.  COR regular S1S2 NEURO: Weakness with handgrip of R hand; full handgrip L hand.  No muscle atrophy noted in R hand.  Uses L hand to lift the right side.       Assessment & Plan:

## 2013-05-18 NOTE — Patient Instructions (Addendum)
It was a pleasure to see you today.   We will keep your medicines the same; they were refilled today.  Fasting labs at next visit, in 4 months (Jan/Feb 2015).   Flu shot today.

## 2013-08-23 ENCOUNTER — Ambulatory Visit (INDEPENDENT_AMBULATORY_CARE_PROVIDER_SITE_OTHER): Payer: Medicare Other | Admitting: Family Medicine

## 2013-08-23 ENCOUNTER — Encounter: Payer: Self-pay | Admitting: Family Medicine

## 2013-08-23 VITALS — BP 138/82 | HR 82 | Temp 97.7°F | Ht 65.5 in | Wt 203.0 lb

## 2013-08-23 DIAGNOSIS — J329 Chronic sinusitis, unspecified: Secondary | ICD-10-CM

## 2013-08-23 DIAGNOSIS — B9789 Other viral agents as the cause of diseases classified elsewhere: Secondary | ICD-10-CM | POA: Insufficient documentation

## 2013-08-23 MED ORDER — CARBAMIDE PEROXIDE 6.5 % OT SOLN: 10.0000 [drp] | Freq: Two times a day (BID) | OTIC | Status: DC

## 2013-08-23 NOTE — Assessment & Plan Note (Signed)
Likely viral URI with mild sinusitis component. Right ear symptoms bothersome to patient. No fever. Atelectasis likely from patient not breathing deeply as poor inspiratory effort displayed in exam, but with no cough, fever, or dyspnea, no chest x-ray warranted at this time. - Ear cleanout improved symptoms. - Debrox prescribed for remaining cerumen impaction and asked patient to not use q-tips. - Recommended honey, warm liquids, nasal saline, NetiPot.  - Return precautions reviewed.

## 2013-08-23 NOTE — Patient Instructions (Signed)
Good to see you today.  I think you have a cold with a component of a viral sinus infection. This should get better with time. We are cleaning your ears. I would recommend rest, hydration, honey with tea, and you can try nasal saline or neti pot (which you can get at a drug store). Come back in 3-5 days if this is not significantly improving.   Leona Singleton, MD

## 2013-08-23 NOTE — Progress Notes (Signed)
Patient ID: VENTURA LEGGITT, female   DOB: [birth year], 72 y.o.   MRN: [id number] Subjective:   CC: Cough, head cold  HPI:   Patient reports symptoms started 6 days ago with runny nose, mild sore throat, mild sinus pain, sneezing, and two days later coughing. Yesterday, she began having loose stools and diarrhea. Denies inability to tolerate PO, fever, emesis, rash, abdominal pain. She has occasional chills. Nyquil did not help. She was around lots of people two days prior to onset of symptoms, some who were possibly sick. Denies other symptoms.  Review of Systems - Per HPI.   PMH: Reviewed Meds reviewed; additionally - Takes extra-strength tylenol nearly nightly to prevent headaches. - Vitamin C and D3 and Multivitamin  Allergies reviewed Objective:  Physical Exam BP 138/82  Pulse 82  Temp(Src) 97.7 F (36.5 C)  Ht 5' 5.5" (1.664 m)  Wt 203 lb (92.08 kg)  BMI 33.26 kg/m2 GEN: NAD HEENT: Atraumatic, normocephalic, neck supple, EOMI, sclera clear, PERRL, o/p clear, Left TM clear, right obscured by cerumen impaction; sinus pressure to palpation at frontal and maxillary sinuses CV: RRR, no murmurs, rubs, or gallops PULM: CTAB, normal effort, Fine crackles bilateral bases SKIN: No rash or cyanosis; warm and well-perfused EXTR: No lower extremity edema or calf tenderness PSYCH: Mood and affect euthymic, normal rate and volume of speech NEURO: Awake, alert, mildly slowed speech, mildly slowed movements but no gross focal deficits.    Assessment:     REENE HARLACHER is a 72 y.o. female here for cold symptoms.    Plan:     # See problem list and after visit summary for problem-specific plans.   # Health Maintenance: Not discussed.  Follow-up: Follow up in 3-5 days if symptoms do not improve.    Leona Singleton, MD [facility]

## 2013-12-14 ENCOUNTER — Encounter: Payer: Self-pay | Admitting: Family Medicine

## 2013-12-14 ENCOUNTER — Ambulatory Visit (INDEPENDENT_AMBULATORY_CARE_PROVIDER_SITE_OTHER): Payer: Medicare Other | Admitting: Family Medicine

## 2013-12-14 VITALS — BP 146/80 | HR 60 | Ht 65.5 in | Wt 206.0 lb

## 2013-12-14 DIAGNOSIS — R413 Other amnesia: Secondary | ICD-10-CM

## 2013-12-14 DIAGNOSIS — I10 Essential (primary) hypertension: Secondary | ICD-10-CM

## 2013-12-14 DIAGNOSIS — I6990 Unspecified sequelae of unspecified cerebrovascular disease: Secondary | ICD-10-CM

## 2013-12-14 DIAGNOSIS — R51 Headache: Secondary | ICD-10-CM

## 2013-12-14 DIAGNOSIS — I6930 Unspecified sequelae of cerebral infarction: Secondary | ICD-10-CM

## 2013-12-14 LAB — CBC
HCT: 40.8 % (ref 36.0–46.0)
Hemoglobin: 14.6 g/dL (ref 12.0–15.0)
MCH: 32.9 pg (ref 26.0–34.0)
MCHC: 35.8 g/dL (ref 30.0–36.0)
MCV: 91.9 fL (ref 78.0–100.0)
Platelets: 257 10*3/uL (ref 150–400)
RBC: 4.44 MIL/uL (ref 3.87–5.11)
RDW: 12.5 % (ref 11.5–15.5)
WBC: 8.8 10*3/uL (ref 4.0–10.5)

## 2013-12-14 LAB — LIPID PANEL
CHOLESTEROL: 147 mg/dL (ref 0–200)
HDL: 39 mg/dL — AB (ref >39)
LDL Cholesterol: 76 mg/dL (ref 0–99)
TRIGLYCERIDES: 162 mg/dL — AB (ref <150)
Total CHOL/HDL Ratio: 3.8 Ratio
VLDL: 32 mg/dL (ref 0–40)

## 2013-12-14 LAB — COMPREHENSIVE METABOLIC PANEL
ALT: 14 U/L (ref 0–35)
AST: 18 U/L (ref 0–37)
Albumin: 4.3 g/dL (ref 3.5–5.2)
Alkaline Phosphatase: 68 U/L (ref 39–117)
BUN: 14 mg/dL (ref 6–23)
CO2: 25 meq/L (ref 19–32)
Calcium: 9.2 mg/dL (ref 8.4–10.5)
Chloride: 104 mEq/L (ref 96–112)
Creat: 0.64 mg/dL (ref 0.50–1.10)
Glucose, Bld: 85 mg/dL (ref 70–99)
Potassium: 4.7 mEq/L (ref 3.5–5.3)
SODIUM: 139 meq/L (ref 135–145)
TOTAL PROTEIN: 7.4 g/dL (ref 6.0–8.3)
Total Bilirubin: 0.9 mg/dL (ref 0.2–1.2)

## 2013-12-14 MED ORDER — PROPRANOLOL HCL 20 MG PO TABS: 20.0000 mg | ORAL_TABLET | Freq: Three times a day (TID) | ORAL | Status: DC

## 2013-12-14 MED ORDER — SIMVASTATIN 40 MG PO TABS: 40.0000 mg | ORAL_TABLET | Freq: Every day | ORAL | Status: DC

## 2013-12-14 MED ORDER — ENALAPRIL MALEATE 10 MG PO TABS
10.0000 mg | ORAL_TABLET | Freq: Every day | ORAL | Status: DC
Start: 2013-12-14 — End: 2014-11-15

## 2013-12-14 NOTE — Patient Instructions (Addendum)
It was a pleasure to see you today.   We will check labs today for blood pressure and memory; check blood pressure at home and if top number (systolic) greater than 140 on multiple occasions, we may need to increase her blood pressure medication.   Follow up to dedicate time to memory in the coming 4 to 6 weeks.

## 2013-12-15 LAB — HIV ANTIBODY (ROUTINE TESTING W REFLEX): HIV: NONREACTIVE

## 2013-12-15 LAB — FOLATE

## 2013-12-15 LAB — RPR

## 2013-12-15 LAB — TSH: TSH: 4.846 u[IU]/mL — AB (ref 0.350–4.500)

## 2013-12-15 LAB — VITAMIN B12: Vitamin B-12: 525 pg/mL (ref 211–911)

## 2013-12-16 ENCOUNTER — Telehealth: Payer: Self-pay | Admitting: Family Medicine

## 2013-12-16 DIAGNOSIS — R7989 Other specified abnormal findings of blood chemistry: Secondary | ICD-10-CM

## 2013-12-16 NOTE — Progress Notes (Signed)
   Subjective:    Patient ID: [name], female    DOB: 08/11/1941, 73 y.o.   MRN: [id number]  HPI Patient here accompanied by husband, who helps care for her at home.  She continues with weekly headaches only on Mondays; come on and last for 1 hour until 5pm, then clear up rapidly.  Her husband had reported that the propranolol had been working initially but recently her headaches had worsened despite taking it three times daily.  Says her headaches rate a 6/10 and range from 5-7/10.  At one point had been 10/10, but recently somewhat better.  Sleeps a lot when she's with headache.  Dizziness better. Husband reports that pt's short-term memory has been waning. Forgets things, including word finding.     Review of Systems No fevers or chills, no nausea or vomiting.      Objective:   Physical Exam Well appearing, no apparent distress. HEENT Neck supple, no cervical adenopathy.  COR Regular S1S2 PULM Clear bilaterally, no rales or wheezes.        Assessment & Plan:

## 2013-12-16 NOTE — Assessment & Plan Note (Signed)
Patient with stroke history, husband reports recent worsening in memory.  Workup to include TSH, RPR and vitamin B12 and folate.  To discuss role of neuroimaging at follow up visit after labs.  To continue on high-dose statin, blood pressure control.

## 2013-12-16 NOTE — Telephone Encounter (Signed)
Called and discussed elevated TSH with husband/caregiver.  Plan for nonfasting TSH and free T4 before decision to start LT4.  Patient to be seen again in 8 weeks.  JB

## 2013-12-16 NOTE — Assessment & Plan Note (Signed)
Patient with prior left brain intracranial hemorrhage in the past. No changes in medications at this time.

## 2013-12-24 ENCOUNTER — Other Ambulatory Visit: Payer: Medicare Other

## 2013-12-24 DIAGNOSIS — R7989 Other specified abnormal findings of blood chemistry: Secondary | ICD-10-CM

## 2013-12-24 NOTE — Progress Notes (Signed)
TSH AND F-T4 DONE TODAY [name] 

## 2013-12-25 LAB — T4, FREE: Free T4: 1.06 ng/dL (ref 0.80–1.80)

## 2013-12-25 LAB — TSH: TSH: 4.841 u[IU]/mL — ABNORMAL HIGH (ref 0.350–4.500)

## 2013-12-27 ENCOUNTER — Telehealth: Payer: Self-pay | Admitting: Family Medicine

## 2013-12-27 NOTE — Telephone Encounter (Signed)
THank you. JB

## 2013-12-27 NOTE — Telephone Encounter (Signed)
Please call to let patient know that the repeat thyroid testing does not require treatment at this time.  I would like to recheck the thyroid test again in the coming 4 to 6 months.  Thank you,  JB

## 2013-12-27 NOTE — Telephone Encounter (Signed)
Pt's husband, notified.  Radene OuKristen L Derrall Hicks, CMA

## 2014-05-13 ENCOUNTER — Ambulatory Visit (INDEPENDENT_AMBULATORY_CARE_PROVIDER_SITE_OTHER): Payer: Medicare Other | Admitting: Family Medicine

## 2014-05-13 ENCOUNTER — Encounter: Payer: Self-pay | Admitting: Family Medicine

## 2014-05-13 VITALS — BP 128/76 | HR 67 | Temp 98.1°F | Ht 65.5 in | Wt 207.7 lb

## 2014-05-13 DIAGNOSIS — R413 Other amnesia: Secondary | ICD-10-CM

## 2014-05-13 DIAGNOSIS — R946 Abnormal results of thyroid function studies: Secondary | ICD-10-CM

## 2014-05-13 DIAGNOSIS — I10 Essential (primary) hypertension: Secondary | ICD-10-CM

## 2014-05-13 DIAGNOSIS — R51 Headache: Secondary | ICD-10-CM

## 2014-05-13 DIAGNOSIS — R7989 Other specified abnormal findings of blood chemistry: Secondary | ICD-10-CM

## 2014-05-13 MED ORDER — MECLIZINE HCL 12.5 MG PO TABS: 12.5000 mg | ORAL_TABLET | Freq: Three times a day (TID) | ORAL | Status: AC | PRN

## 2014-05-13 NOTE — Progress Notes (Signed)
   Subjective:    Patient ID: [name], female    DOB: April 16, 1941, 73 y.o.   MRN: [id number]  HPI Patient here with her husband for follow up of her headaches, as well as for complaint of worsening short-term memory loss.  Patient has had recurrent headaches which occur on Mondays, begin at 7-8am and resolve 5-6pm every Monday.  Not related to activity, location (even when traveling), sleep or other factors.  Previously had been stable (not worsening) on propranolol  three times daily, she had reduced to twice daily.  Past treatment has included Depakote which did not reduce frequency/intensity of headache.   Over the past nine weeks, she has also been having episodes of dizziness and some associated headache on Wednesdays.  The headache this past Weds was 7 out of 10 on pain scale. "Face draws up" when she has them.  Not as long in duration as the HAs on Mondays, last for only an hour. Tylenol does not help.  Meclizine 12,5mg  helps her to sleep and get rid of the dizziness. No N/V. No visual disturbances.   Husband voices concern over recent short-term memory loss.  Will not be able to recall what she had for lunch; forgets events that happened recently. He says she was writing Thank You cards recently for gifts they received for their 50th anniversary, was able to copy from the address book, but then would have trouble spelling words that she has known (husband gives example that she could not recall how to spell, "beautiful'.)   Of note, patient had marginally elevated TSH with normal free T4 earlier this year.  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  Residual weakness in RUE.     Review of Systems Denies fevers or chills, no chest pain, no shortness of breath.     Objective:   Physical Exam Well appearing, no apparent distress. Does not use her R hand/arm.  HEENT Neck supple, pupils mid-sized. EOMI. Clear oropharynx. No thyroid nodules or enlargement. No  cervical adenopathy. No frontal or maxillary sinus tenderness. TMs clear bilaterally COR regular S1S2 PULM CLear bilaterally, no rales or wheezes NEURO: Sensory in LUE intact; reports decreased sensation in L hand with light touch. Slight hand grip in L hand. CNs intact (II-XII). MMSE: Oriented to month ("Sept"), not to day, year, or day of the week.  Oriented to "Doctor's office" and "[place]".  Gets 3/3 immediate recall, 2/3 at 5 minutes.  Able to follow 3-part command. Did not have patient draw/write. Able to enunciate "no if's and's or but's" clearly.         Assessment & Plan:

## 2014-05-13 NOTE — Assessment & Plan Note (Signed)
Husband reports worsening short-term memory loss. Some evidence of this on elements of MMSE that were done today. RPR checked earlier this year, negative. Follow up on TSH and free T3/T4, given mild elevated TSH earlier.  Consider Neuroimaging; however, in light of decision to refer to Neurology, will not order at this time.

## 2014-05-13 NOTE — Assessment & Plan Note (Signed)
Previously mildly elevated TSH with normal free T4.  Recheck today.

## 2014-05-13 NOTE — Assessment & Plan Note (Addendum)
Long discussion about decision to try new class of medication (ie, vanlafaxine) versus resuming propranolol  to three times daily and pursuing Neurology consult.  She states she would prefer the fresh opinion of a different neurologist from the one she had seen in the past.  Will make referral. Defer Neuroimaging to Neurology at consult.

## 2014-05-13 NOTE — Assessment & Plan Note (Signed)
Well controlled today.

## 2014-05-13 NOTE — Patient Instructions (Signed)
It was a pleasure to see you today.  I am sorry to hear that your headaches and dizzy spells are getting more frequent.   Please increase the propranolol  to THREE TIMES DAILY for now.   I am checking your thyroid testing again today, will be in touch with the results when available.   I am going to refer you to a Neurologist to help in the diagnosis of the headaches and recent worsening memory loss.   I would like to see you back in the coming 4 to 6 weeks, or sooner as needed.

## 2014-05-14 LAB — T3, FREE: T3 FREE: 2.8 pg/mL (ref 2.3–4.2)

## 2014-05-14 LAB — T4, FREE: FREE T4: 1.1 ng/dL (ref 0.80–1.80)

## 2014-05-14 LAB — TSH: TSH: 4.955 u[IU]/mL — ABNORMAL HIGH (ref 0.350–4.500)

## 2014-05-24 ENCOUNTER — Ambulatory Visit (INDEPENDENT_AMBULATORY_CARE_PROVIDER_SITE_OTHER): Payer: Medicare Other | Admitting: Diagnostic Neuroimaging

## 2014-05-24 ENCOUNTER — Encounter: Payer: Self-pay | Admitting: Diagnostic Neuroimaging

## 2014-05-24 VITALS — BP 139/71 | HR 64 | Temp 97.7°F | Ht 65.5 in | Wt 210.4 lb

## 2014-05-24 DIAGNOSIS — I619 Nontraumatic intracerebral hemorrhage, unspecified: Secondary | ICD-10-CM

## 2014-05-24 DIAGNOSIS — R5381 Other malaise: Secondary | ICD-10-CM

## 2014-05-24 DIAGNOSIS — R413 Other amnesia: Secondary | ICD-10-CM

## 2014-05-24 DIAGNOSIS — R42 Dizziness and giddiness: Secondary | ICD-10-CM

## 2014-05-24 DIAGNOSIS — R5383 Other fatigue: Secondary | ICD-10-CM

## 2014-05-24 DIAGNOSIS — R51 Headache: Secondary | ICD-10-CM

## 2014-05-24 MED ORDER — TOPIRAMATE 50 MG PO TABS: 50.0000 mg | ORAL_TABLET | Freq: Two times a day (BID) | ORAL | Status: DC

## 2014-05-24 NOTE — Patient Instructions (Signed)
Try topiramate 50mg  at bedtime for 2 weeks, then increase to twice a day.  Drink plenty of water with this medication.

## 2014-05-24 NOTE — Progress Notes (Signed)
[facility]  PATIENT: [name] DOB: 03-06-1941  REFERRING CLINICIAN: Mauricio Po HISTORY FROM: patient  REASON FOR VISIT: new consult    HISTORICAL  CHIEF COMPLAINT:  Chief Complaint  Patient presents with  . Headache    HISTORY OF PRESENT ILLNESS:   73 year old right-handed female here for evaluation of headache, memory loss, dizziness.  06/19/2002, patient had sudden onset right-sided weakness, slurred speech, aphasia, diagnosed with left brain intracerebral hemorrhage. Patient had significant hospitalization, rehabilitation, recovery period ever since that time patient has had intermittent frontal headaches, associated with hours of fatigue, malaise, nausea, right arm pain. Symptoms have been occurring once per week for past 11-12 years. Over the past 4 years symptoms have slightly changed and now consistently occur on Monday mornings. Symptoms last for Monday morning at 7 AM until 6 PM.  Over past 9 weeks this has had intermittent dizziness, "swimmy headed" sensation with lightheadedness and some imbalance. Over the same timeframe he has had increasing short-term memory loss.   REVIEW OF SYSTEMS: Full 14 system review of systems performed and notable only for memory loss headache dizziness.  ALLERGIES: Allergies  Allergen Reactions  . Azithromycin   . Codeine   . Macrobid [Nitrofurantoin Macrocrystal]   . Tdap [Diphth-Acell Pertussis-Tetanus]     HOME MEDICATIONS: Outpatient Prescriptions Prior to Visit  Medication Sig Dispense Refill  . carbamide peroxide (DEBROX) 6.5 % otic solution Place 10 drops into both ears 2 (two) times daily. Dispense 1 bottle  1 mL  0  . enalapril (VASOTEC) 10 MG tablet Take 1 tablet (10 mg total) by mouth daily.  90 tablet  3  . meclizine (ANTIVERT) 12.5 MG tablet Take 1 tablet (12.5 mg total) by mouth 3 (three) times daily as needed for dizziness.  30 tablet  3  . propranolol (INDERAL) 20 MG tablet Take 1 tablet (20  mg total) by mouth 3 (three) times daily.  270 tablet  3  . simvastatin (ZOCOR) 40 MG tablet Take 1 tablet (40 mg total) by mouth at bedtime.  90 tablet  3   No facility-administered medications prior to visit.    PAST MEDICAL HISTORY: Past Medical History  Diagnosis Date  . Stroke     PAST SURGICAL HISTORY: History reviewed. No pertinent past surgical history.  FAMILY HISTORY: Family History  Problem Relation Age of Onset  . Dementia Mother     SOCIAL HISTORY:  History   Social History  . Marital Status: Married    Spouse Name: Derek Mound    Number of Children: 2  . Years of Education: HS   Occupational History  .  Other   Social History Main Topics  . Smoking status: Never Smoker   . Smokeless tobacco: Never Used  . Alcohol Use: No  . Drug Use: No  . Sexual Activity: Not on file   Other Topics Concern  . Not on file   Social History Narrative   Patient lives at home with her spouse.   Caffeine Use: occasionally     PHYSICAL EXAM  Filed Vitals:   05/24/14 0951  BP: 139/71  Pulse: 64  Temp: 97.7 F (36.5 C)  TempSrc: Oral  Height: 5' 5.5" (1.664 m)  Weight: 210 lb 6.4 oz (95.437 kg)    Not recorded    Body mass index is 34.47 kg/(m^2).  GENERAL EXAM: Patient is in no distress; well developed, nourished and groomed; neck is supple  CARDIOVASCULAR: Regular rate and rhythm, no murmurs, no carotid bruits  NEUROLOGIC: MENTAL STATUS: awake, alert, DECR FLUENCY, COMP INTACT; naming intact, fund of knowledge appropriate CRANIAL NERVE: no papilledema on fundoscopic exam, pupils equal and reactive to light, visual fields full to confrontation, extraocular muscles intact, no nystagmus, facial sensation symmetric, DECR RIGHT LOWER FACIAL STRENGTH, hearing intact, palate elevates symmetrically, uvula midline, shoulder shrug symmetric, tongue midline. MOTOR: normal bulk; INCR TONE IN RUE ADN RLE. RUE 3, RLE (PROX 4, DISTAL 2; RIGHT AFO BRACE), full strength  in the LUE, LLE SENSORY: normal and symmetric to light touch, temperature, vibration COORDINATION: finger-nose-finger, fine finger movements, heel-shin normal REFLEXES: deep tendon reflexes BRISK IN RUE; ELSEWHERE 1+; RIGHT ANKLE NOT TESTED DUE TO AFO BRACE GAIT/STATION: RIGHT HEMIPARETIC GAIT; UNSTEADY.     DIAGNOSTIC DATA (LABS, IMAGING, TESTING) - I reviewed patient records, labs, notes, testing and imaging myself where available.  Lab Results  Component Value Date   WBC 8.8 12/14/2013   HGB 14.6 12/14/2013   HCT 40.8 12/14/2013   MCV 91.9 12/14/2013   PLT 257 12/14/2013      Component Value Date/Time   NA 139 12/14/2013 1159   K 4.7 12/14/2013 1159   CL 104 12/14/2013 1159   CO2 25 12/14/2013 1159   GLUCOSE 85 12/14/2013 1159   BUN 14 12/14/2013 1159   CREATININE 0.64 12/14/2013 1159   CALCIUM 9.2 12/14/2013 1159   PROT 7.4 12/14/2013 1159   ALBUMIN 4.3 12/14/2013 1159   AST 18 12/14/2013 1159   ALT 14 12/14/2013 1159   ALKPHOS 68 12/14/2013 1159   BILITOT 0.9 12/14/2013 1159   Lab Results  Component Value Date   CHOL 147 12/14/2013   HDL 39* 12/14/2013   LDLCALC 76 12/14/2013   LDLDIRECT 78 12/29/2012   TRIG 162* 12/14/2013   CHOLHDL 3.8 12/14/2013   No results found for this basename: HGBA1C   Lab Results  Component Value Date   VITAMINB12 525 12/14/2013   Lab Results  Component Value Date   TSH 4.955* 05/13/2014      ASSESSMENT AND PLAN  73 y.o. year old female here with history of left brain intracerebral hemorrhage, with resultant right hemiparesis, decreased fluency, mild dysarthria, now with chronic headaches, intermittent dizziness, memory loss.  Ddx: migraine variant, seizure, tension HA, stroke/ICH  PLAN:  Orders Placed This Encounter  Procedures  . MR Brain W Wo Contrast  . EEG adult   Meds ordered this encounter  Medications  . topiramate (TOPAMAX) 50 MG tablet    Sig: Take 1 tablet (50 mg total) by mouth 2 (two) times daily.    Dispense:  60 tablet     Refill:  12   Return in about 3 months (around 08/23/2014).    Suanne MarkerVIKRAM R. PENUMALLI, MD 05/24/2014, 10:58 AM Certified in Neurology, Neurophysiology and Neuroimaging  [facility] [address], [address] [place], [place] [address] [phone]

## 2014-05-26 ENCOUNTER — Encounter: Payer: Self-pay | Admitting: Family Medicine

## 2014-05-26 ENCOUNTER — Ambulatory Visit (INDEPENDENT_AMBULATORY_CARE_PROVIDER_SITE_OTHER): Payer: Medicare Other | Admitting: Radiology

## 2014-05-26 DIAGNOSIS — R5383 Other fatigue: Secondary | ICD-10-CM

## 2014-05-26 DIAGNOSIS — R413 Other amnesia: Secondary | ICD-10-CM

## 2014-05-26 DIAGNOSIS — R42 Dizziness and giddiness: Secondary | ICD-10-CM

## 2014-05-26 DIAGNOSIS — I619 Nontraumatic intracerebral hemorrhage, unspecified: Secondary | ICD-10-CM

## 2014-05-26 DIAGNOSIS — R51 Headache: Secondary | ICD-10-CM

## 2014-05-26 DIAGNOSIS — R5381 Other malaise: Secondary | ICD-10-CM

## 2014-06-01 ENCOUNTER — Telehealth: Payer: Self-pay | Admitting: Diagnostic Neuroimaging

## 2014-06-01 NOTE — Telephone Encounter (Signed)
Spoke with patient spouse he reports she always has bad Monday's. Since starting Topiramate she is worst as far as being lethargic and dizzy. She is doing a lot of sleeping.

## 2014-06-01 NOTE — Telephone Encounter (Signed)
Patient's husband calling to state that since patient started Topiramate, she has not been feeling well, please return call and advise.

## 2014-06-03 ENCOUNTER — Ambulatory Visit
Admission: RE | Admit: 2014-06-03 | Discharge: 2014-06-03 | Disposition: A | Discharge status: Home / Self Care | Payer: Medicare Other | Source: Ambulatory Visit | Attending: Diagnostic Neuroimaging | Admitting: Diagnostic Neuroimaging

## 2014-06-03 DIAGNOSIS — R413 Other amnesia: Secondary | ICD-10-CM

## 2014-06-03 DIAGNOSIS — R5383 Other fatigue: Secondary | ICD-10-CM

## 2014-06-03 DIAGNOSIS — I619 Nontraumatic intracerebral hemorrhage, unspecified: Secondary | ICD-10-CM

## 2014-06-03 DIAGNOSIS — R5381 Other malaise: Secondary | ICD-10-CM

## 2014-06-03 DIAGNOSIS — R42 Dizziness and giddiness: Secondary | ICD-10-CM

## 2014-06-03 MED ORDER — GADOBENATE DIMEGLUMINE 529 MG/ML IV SOLN
19.0000 mL | Freq: Once | INTRAVENOUS | Status: AC | PRN
  Administered 2014-06-03: 19 mL via INTRAVENOUS

## 2014-06-06 NOTE — Telephone Encounter (Signed)
I called patient. Taking TPX 50mg   Initially on TPX 50mg  with dizziness. Now getting adjusted. Will continue and then go up to 50mg  BID over the next week. -VRP

## 2014-06-16 NOTE — Procedures (Signed)
   [facility]  EEG (ELECTROENCEPHALOGRAM) REPORT   STUDY DATE: 05/26/14 PATIENT NAME: Amber Ludwigatricia A Meinhardt DOB: 04/03/1941 MRN: [id number]  ORDERING CLINICIAN: Joycelyn SchmidVikram Penumalli, MD   TECHNOLOGIST: Kaylyn LimSue Fox  TECHNIQUE: Electroencephalogram was recorded utilizing standard 10-20 system of lead placement and reformatted into average and bipolar montages.  RECORDING TIME: 30 minutes  ACTIVATION: photic stimulation  CLINICAL INFORMATION: 73 year old female with history of left brain intracerebral hemorrhage.   FINDINGS: Background rhythms of 9-10 hertz and 40-50 microvolts on the right side and 7-8 hertz and 50-60 microvolts on the left side. No epileptiform activity or seizures are seen. Patient recorded in the awake state. EKG channel shows 66 beats per minute.  IMPRESSION:  Abnormal EEG in the awake state demonstrating: 1. Mild left hemispheric slowing in the theta range. 2. No epileptiform activity or seizures are seen.    INTERPRETING PHYSICIAN:  Suanne MarkerVIKRAM R. PENUMALLI, MD Certified in Neurology, Neurophysiology and Neuroimaging  [facility] [address], [address] [place], [place] [address] [phone]

## 2014-07-12 ENCOUNTER — Ambulatory Visit: Payer: Medicare Other | Admitting: *Deleted

## 2014-07-12 ENCOUNTER — Ambulatory Visit (INDEPENDENT_AMBULATORY_CARE_PROVIDER_SITE_OTHER): Payer: Medicare Other | Admitting: *Deleted

## 2014-07-12 DIAGNOSIS — Z23 Encounter for immunization: Secondary | ICD-10-CM

## 2014-08-23 ENCOUNTER — Encounter: Payer: Self-pay | Admitting: Diagnostic Neuroimaging

## 2014-08-23 ENCOUNTER — Ambulatory Visit (INDEPENDENT_AMBULATORY_CARE_PROVIDER_SITE_OTHER): Payer: Medicare Other | Admitting: Diagnostic Neuroimaging

## 2014-08-23 VITALS — BP 140/74 | HR 84 | Ht 65.5 in | Wt 204.8 lb

## 2014-08-23 DIAGNOSIS — I619 Nontraumatic intracerebral hemorrhage, unspecified: Secondary | ICD-10-CM

## 2014-08-23 DIAGNOSIS — F32A Depression, unspecified: Secondary | ICD-10-CM

## 2014-08-23 DIAGNOSIS — R413 Other amnesia: Secondary | ICD-10-CM

## 2014-08-23 DIAGNOSIS — R42 Dizziness and giddiness: Secondary | ICD-10-CM

## 2014-08-23 DIAGNOSIS — R5381 Other malaise: Secondary | ICD-10-CM

## 2014-08-23 DIAGNOSIS — F329 Major depressive disorder, single episode, unspecified: Secondary | ICD-10-CM

## 2014-08-23 DIAGNOSIS — R5383 Other fatigue: Secondary | ICD-10-CM

## 2014-08-23 NOTE — Progress Notes (Signed)
[facility]  PATIENT: [name] DOB: 10/29/1940  REFERRING CLINICIAN: Breen HISTORY FROM: patient and husband and daughter  REASON FOR VISIT: follow up   HISTORICAL  CHIEF COMPLAINT:  Chief Complaint  Patient presents with  . Follow-up    HISTORY OF PRESENT ILLNESS:   UPDATE 08/23/14: Since last visit, tried TPX again 50mg  daily, with no benefit. Tried BID dosing, but this increased "sick" feeling --> dizzy, fatigue, headaches. Has intermittent crying spells. Has prior mild depression before her stroke, which she managed with exercise and non-medication techniques.   PRIOR HPI (05/24/14): 73 year old right-handed female here for evaluation of headache, memory loss, dizziness. 06/19/2002, patient had sudden onset right-sided weakness, slurred speech, aphasia, diagnosed with left brain intracerebral hemorrhage. Patient had significant hospitalization, rehabilitation, recovery period ever since that time patient has had intermittent frontal headaches, associated with hours of fatigue, malaise, nausea, right arm pain. Symptoms have been occurring once per week for past 11-12 years. Over the past 4 years symptoms have slightly changed and now consistently occur on Monday mornings. Symptoms last for Monday morning at 7 AM until 6 PM. Over past 9 weeks this has had intermittent dizziness, "swimmy headed" sensation with lightheadedness and some imbalance. Over the same timeframe he has had increasing short-term memory loss.   REVIEW OF SYSTEMS: Full 14 system review of systems performed and notable only for memory loss headache dizziness speech difficulty.  ALLERGIES: Allergies  Allergen Reactions  . Azithromycin   . Codeine   . Macrobid [Nitrofurantoin Macrocrystal]   . Tdap [Diphth-Acell Pertussis-Tetanus]     HOME MEDICATIONS: Outpatient Prescriptions Prior to Visit  Medication Sig Dispense Refill  . enalapril (VASOTEC) 10 MG tablet Take 1 tablet (10 mg  total) by mouth daily. 90 tablet 3  . meclizine (ANTIVERT) 12.5 MG tablet Take 1 tablet (12.5 mg total) by mouth 3 (three) times daily as needed for dizziness. 30 tablet 3  . simvastatin (ZOCOR) 40 MG tablet Take 1 tablet (40 mg total) by mouth at bedtime. 90 tablet 3  . topiramate (TOPAMAX) 50 MG tablet Take 1 tablet (50 mg total) by mouth 2 (two) times daily. 60 tablet 12  . carbamide peroxide (DEBROX) 6.5 % otic solution Place 10 drops into both ears 2 (two) times daily. Dispense 1 bottle 1 mL 0  . propranolol (INDERAL) 20 MG tablet Take 1 tablet (20 mg total) by mouth 3 (three) times daily. (Patient not taking: Reported on 08/23/2014) 270 tablet 3   No facility-administered medications prior to visit.    PAST MEDICAL HISTORY: Past Medical History  Diagnosis Date  . Stroke     PAST SURGICAL HISTORY: History reviewed. No pertinent past surgical history.  FAMILY HISTORY: Family History  Problem Relation Age of Onset  . Dementia Mother     SOCIAL HISTORY:  History   Social History  . Marital Status: Married    Spouse Name: Derek MoundRicardo    Number of Children: 2  . Years of Education: HS   Occupational History  .  Other   Social History Main Topics  . Smoking status: Never Smoker   . Smokeless tobacco: Never Used  . Alcohol Use: No  . Drug Use: No  . Sexual Activity: Not on file   Other Topics Concern  . Not on file   Social History Narrative   Patient lives at home with her spouse.   Caffeine Use: occasionally     PHYSICAL EXAM  Filed Vitals:   08/23/14 1258  BP: 140/74  Pulse: 84  Height: 5' 5.5" (1.664 m)  Weight: 204 lb 12.8 oz (92.897 kg)    Not recorded      Body mass index is 33.55 kg/(m^2).  GENERAL EXAM: Patient is in no distress; well developed, nourished and groomed; neck is supple  CARDIOVASCULAR: Regular rate and rhythm, no murmurs, no carotid bruits  NEUROLOGIC: MENTAL STATUS: awake, alert, DECR FLUENCY, COMP INTACT; naming intact,  fund of knowledge appropriate; INTERMITTENTLY TEARFUL.  CRANIAL NERVE: no papilledema on fundoscopic exam, pupils equal and reactive to light, visual fields full to confrontation, extraocular muscles intact, no nystagmus, facial sensation symmetric, DECR RIGHT LOWER FACIAL STRENGTH, hearing intact, palate elevates symmetrically, uvula midline, shoulder shrug symmetric, tongue midline. MOTOR: normal bulk; INCR TONE IN RUE ADN RLE. RUE 3, RLE (PROX 4, DISTAL 2; RIGHT AFO BRACE), full strength in the LUE, LLE SENSORY: normal and symmetric to light touch, temperature, vibration COORDINATION: finger-nose-finger, fine finger movements normal REFLEXES: deep tendon reflexes BRISK IN RUE; ELSEWHERE 1+ GAIT/STATION: RIGHT HEMIPARETIC GAIT; UNSTEADY.     DIAGNOSTIC DATA (LABS, IMAGING, TESTING) - I reviewed patient records, labs, notes, testing and imaging myself where available.  Lab Results  Component Value Date   WBC 8.8 12/14/2013   HGB 14.6 12/14/2013   HCT 40.8 12/14/2013   MCV 91.9 12/14/2013   PLT 257 12/14/2013      Component Value Date/Time   NA 139 12/14/2013 1159   K 4.7 12/14/2013 1159   CL 104 12/14/2013 1159   CO2 25 12/14/2013 1159   GLUCOSE 85 12/14/2013 1159   BUN 14 12/14/2013 1159   CREATININE 0.64 12/14/2013 1159   CALCIUM 9.2 12/14/2013 1159   PROT 7.4 12/14/2013 1159   ALBUMIN 4.3 12/14/2013 1159   AST 18 12/14/2013 1159   ALT 14 12/14/2013 1159   ALKPHOS 68 12/14/2013 1159   BILITOT 0.9 12/14/2013 1159   Lab Results  Component Value Date   CHOL 147 12/14/2013   HDL 39* 12/14/2013   LDLCALC 76 12/14/2013   LDLDIRECT 78 12/29/2012   TRIG 162* 12/14/2013   CHOLHDL 3.8 12/14/2013   No results found for: HGBA1C Lab Results  Component Value Date   VITAMINB12 525 12/14/2013   Lab Results  Component Value Date   TSH 4.955* 05/13/2014    06/03/14 MRI brain (with and without) demonstrating: 1. Chronic left lenticular nuclei intracerebral hemorrhage, with  cystic encephalomalacia (2.1x1.3cm on axial views), surrounding hemosiderin and gliosis with ex vacuo dilation of left lateral ventricle.  2. No acute findings. No abnormal enhancing lesions.  05/26/14 EEG 1. Mild left hemispheric slowing in the theta range. 2. No epileptiform activity or seizures are seen.    ASSESSMENT AND PLAN  73 y.o. year old female here with history of left brain intracerebral hemorrhage, with resultant right hemiparesis, decreased fluency, mild dysarthria, now with chronic headaches, intermittent dizziness, memory loss. Intolerant of topiramate, nortriptyline. Propranolol not effective.   Ddx: migraine variant, tension HA, post-stroke depression, conversion reaction, neurodegenerative   PLAN: - will try psychiatry evaluation of depression, cyclothymia or conversion reaction - in future, may consider depakote (which can help with migraine, seizure and mood stabilization) - stop topiramate and propranolol  Return in about 3 months (around 11/22/2014).    Suanne MarkerVIKRAM R. PENUMALLI, MD 08/23/2014, 2:02 PM Certified in Neurology, Neurophysiology and Neuroimaging  [facility] [address], [address] [place], [place] [address] [phone]

## 2014-11-15 ENCOUNTER — Ambulatory Visit (INDEPENDENT_AMBULATORY_CARE_PROVIDER_SITE_OTHER): Payer: Medicare Other | Admitting: Family Medicine

## 2014-11-15 ENCOUNTER — Ambulatory Visit: Payer: Medicare Other | Admitting: Diagnostic Neuroimaging

## 2014-11-15 ENCOUNTER — Encounter: Payer: Self-pay | Admitting: Family Medicine

## 2014-11-15 VITALS — BP 144/80 | HR 69 | Temp 97.8°F | Ht 65.5 in | Wt 205.3 lb

## 2014-11-15 DIAGNOSIS — G43809 Other migraine, not intractable, without status migrainosus: Secondary | ICD-10-CM | POA: Diagnosis not present

## 2014-11-15 DIAGNOSIS — I6930 Unspecified sequelae of cerebral infarction: Secondary | ICD-10-CM

## 2014-11-15 DIAGNOSIS — I10 Essential (primary) hypertension: Secondary | ICD-10-CM | POA: Diagnosis not present

## 2014-11-15 DIAGNOSIS — G43009 Migraine without aura, not intractable, without status migrainosus: Secondary | ICD-10-CM

## 2014-11-15 LAB — BASIC METABOLIC PANEL
BUN: 19 mg/dL (ref 6–23)
CHLORIDE: 104 meq/L (ref 96–112)
CO2: 27 mEq/L (ref 19–32)
CREATININE: 0.6 mg/dL (ref 0.50–1.10)
Calcium: 9 mg/dL (ref 8.4–10.5)
GLUCOSE: 87 mg/dL (ref 70–99)
Potassium: 4 mEq/L (ref 3.5–5.3)
Sodium: 138 mEq/L (ref 135–145)

## 2014-11-15 MED ORDER — SIMVASTATIN 40 MG PO TABS
40.0000 mg | ORAL_TABLET | Freq: Every day | ORAL | Status: DC
Start: 2014-11-15 — End: 2015-12-14

## 2014-11-15 MED ORDER — DULOXETINE HCL 30 MG PO CPEP: 30.0000 mg | ORAL_CAPSULE | Freq: Every day | ORAL | Status: DC

## 2014-11-15 MED ORDER — ENALAPRIL MALEATE 20 MG PO TABS: 20.0000 mg | ORAL_TABLET | Freq: Every day | ORAL | Status: DC

## 2014-11-15 NOTE — Patient Instructions (Signed)
It was a pleasure to see you today.   Labs today.   Increase enalapril to 20mg  daily.   Start duloxetine 30mg  at bedtime.  Follow up in 2 weeks to see results.

## 2014-11-16 ENCOUNTER — Telehealth: Payer: Self-pay | Admitting: Family Medicine

## 2014-11-16 ENCOUNTER — Encounter: Payer: Self-pay | Admitting: Family Medicine

## 2014-11-16 DIAGNOSIS — G43009 Migraine without aura, not intractable, without status migrainosus: Secondary | ICD-10-CM

## 2014-11-16 NOTE — Telephone Encounter (Signed)
Pt husband called and said that the pt was prescribed Duloxetine at yesterday's visit. They are expressing concern because it is too expensive for them to purchase. Would like to discuss options / thanks HoneywellSadie Reynolds, ASA

## 2014-11-16 NOTE — Assessment & Plan Note (Signed)
Recurrent HAs with unusual presentation on the same day of the week, every week.  Appreciate Neurology input and consideration for conversion d/o, as no other clear etiology identified on workup. Patient and husband visibly frustrated with lack of progress; also frustrated with lack of improvement with propranolol and Topamax, yet seem resistant to new medication suggestions.  Plan for low-dose SNRI with duloxetine 30mg  at bedtime, to check in 2 weeks from now to see if helping. Would plan for Psychiatry referral if not improving at that time.

## 2014-11-16 NOTE — Assessment & Plan Note (Signed)
To improve BP control with increase in dose of enalapril to 20mg  daily. She is on a statin, discussed rationale behind recommending high-potency atorvastatin.  Patient and husband prefer that she continue on the statin she is taking now. No new changes.

## 2014-11-16 NOTE — Progress Notes (Signed)
   Subjective:    Patient ID: [name], female    DOB: 07/27/1941, 74 y.o.   MRN: [id number]  HPI Patient presents with husband today, for follow up of her recurrent headaches that occur only on Mondays.  She has also had weekly bouts of dizziness, feeling "swimmy-headed", which occur on Wednesdays.  She has had a few Tuesdays that have not been so good either.  HUsband notes that she has had this pattern of recurrent headaches for a number of years, since her stroke that occurred on Saturday, June 19, 2002 at 11am while at church.  Initially the HAs would occur on one day per week between Monday and Wednesday, "they would pick their day".  As time went on, the headaches became recurrent only on Mondays.  She has not been able to explain why the headaches only occur on that day; cannot think of any traumatic event that occurred on a Monday which might be tied to the headache. Says that she feels wonderful on Sundays, every week finds it odd how quickly she can change from feeling great (Sunday) to feeling miserable (Monday).  The HAs always let up at 4-5pm after an 8-hour spell.  Patient and husband report frustration with being unable to get relief from this pattern. Most recently seen by Neurology on 12/29, were under impression she would be referred to psychiatrist but this did not happen.  Has not been taking propranolol or Topamax for quite some time now; didn't notice any difference with vs without the meds.  Takes Tylenol now for the headaches when they occur. May take one tylenol each night as well. She had neuroimaging and EEG, results reviewed in Epic. Husband cancelled her appointment with Neurology scheduled for this afternoon.   Review of Systems     Objective:   Physical Exam Generally well appearing. Oriented to place ("doctor's office"), city [place] year and month.   Able to get up to exam table with help of 2 people. Marked RUE weakness, near-absent handgrip on  right hand. Weakness of R LE as well. EOMI. PERRL. No frontal or maxillary sinus tenderness. No cervical adenopathy. Speech fluid.  COR Regular S1S2 PULM Clear bilaterally, no rales or wheezes.         Assessment & Plan:

## 2014-11-17 MED ORDER — PAROXETINE HCL 20 MG PO TABS: 20.0000 mg | ORAL_TABLET | Freq: Every day | ORAL | Status: DC

## 2014-11-17 NOTE — Telephone Encounter (Signed)
Please let patient/husband know that I have changed to another (similar) medication, Paroxetine 20mg , take one tablet every day in the morning.  [facility]  Paula ComptonJames Naasia Weilbacher, MD

## 2014-11-17 NOTE — Telephone Encounter (Signed)
Patient husband informed, expressed understanding. 

## 2014-11-29 ENCOUNTER — Encounter: Payer: Self-pay | Admitting: Family Medicine

## 2014-11-29 ENCOUNTER — Other Ambulatory Visit: Payer: Self-pay | Admitting: Family Medicine

## 2014-11-29 DIAGNOSIS — G43009 Migraine without aura, not intractable, without status migrainosus: Secondary | ICD-10-CM

## 2014-11-29 DIAGNOSIS — I10 Essential (primary) hypertension: Secondary | ICD-10-CM

## 2014-11-29 MED ORDER — ENALAPRIL MALEATE 10 MG PO TABS: 10.0000 mg | ORAL_TABLET | Freq: Every day | ORAL | Status: DC

## 2014-11-29 MED ORDER — PAROXETINE HCL 20 MG PO TABS: 10.0000 mg | ORAL_TABLET | Freq: Every day | ORAL | Status: DC

## 2014-11-29 NOTE — Assessment & Plan Note (Signed)
Husband relays to me in his office visit that patient has not tolerated Paxil 20mg ; took one dose and had awful diarrhea, did not take again. Plan to cut in half (scored tablets) and take 10mg  daily.  Stop if recurrent diarrhoea. Will need to follow up for further addressing of this problem.

## 2014-11-29 NOTE — Assessment & Plan Note (Signed)
Husband reports that patient has been more dizzy since increase in enalapril from 10 to 20mg  daily.  Has gone back to 10mg  daily. New RX needed.

## 2014-12-06 ENCOUNTER — Telehealth: Payer: Self-pay | Admitting: Family Medicine

## 2014-12-06 NOTE — Telephone Encounter (Signed)
FWD to Dr. Mauricio PoBreen who had prescribed the medication

## 2014-12-06 NOTE — Telephone Encounter (Signed)
[name] calling to say that the Paroextine that was given to patient for headaches and dizziness is causing patient to have diarrhea even when she cut tablet in half.  Please call husband back to advise

## 2014-12-07 ENCOUNTER — Telehealth: Payer: Self-pay | Admitting: Family Medicine

## 2014-12-07 NOTE — Telephone Encounter (Signed)
Spoke with Tia and she called the psych office to see if they received anything on the pt from neurology office and they didn't. Tia will send referral and fax over notes to psych office. Deseree Bruna PotterBlount, CMA

## 2014-12-07 NOTE — Telephone Encounter (Signed)
I returned call to patient, spoke with her husband.  He says that the 1/2 tablet of Paxil causes her to have diarrhoea all night. Got through about 4 days of taking the medicine before the 1/2 tab gave side effects (full tablet gave her diarrhoea immediately). Plan to hold the Paxil.  I suspect her headaches and associated symptoms (only on Mondays) are a manifestation of conversion disorder. She was seen by Neurology and referred to Psychiatry, but family never heard back about the state of this referral (I see the referral in her Epic chart).   Will ask nursing team to check on status of Psychiatry referral and contact patient's husband about this.   Fayrene FearingJames

## 2014-12-07 NOTE — Telephone Encounter (Signed)
Will forward to [facility] to see if there is somewhere else patient can go with insurance. Jazmin Hartsell,CMA

## 2014-12-07 NOTE — Telephone Encounter (Signed)
The original referral came from [facility]. Will Dr. Mauricio PoBreen need to place a referral first?

## 2014-12-07 NOTE — Telephone Encounter (Signed)
Spoke with husband and asked him to [employer] insurance company and see who is in network with them.  Also he will need to let [facility] know so they send patient to the correct office.  He voiced understanding on this.  Jazmin Hartsell,CMA

## 2014-12-07 NOTE — Telephone Encounter (Signed)
Husband called because Dr. Mauricio PoBreen made his wife an appointment at a different doctor but when they called about the appointment they were told that they do not except Medicare. jw

## 2015-02-15 ENCOUNTER — Encounter: Payer: Self-pay | Admitting: Family Medicine

## 2015-02-15 ENCOUNTER — Telehealth: Payer: Self-pay | Admitting: Family Medicine

## 2015-02-15 NOTE — Telephone Encounter (Signed)
Has jury summons for aug 9.  She needs a letter stating she cant serve due to her disability Has a stroke 12 yrs ago--has memory loss and cannot work without assistance Please call when letter is ready.

## 2015-02-15 NOTE — Telephone Encounter (Signed)
Pt informed. [name], [name]  

## 2015-02-15 NOTE — Telephone Encounter (Signed)
Letter written.  Please print and let pt know it is available for pick up at her convenience.  Thanks, Judie Grieve

## 2015-05-05 ENCOUNTER — Encounter: Payer: Self-pay | Admitting: *Deleted

## 2015-05-05 ENCOUNTER — Other Ambulatory Visit: Payer: Self-pay | Admitting: Family Medicine

## 2015-05-05 NOTE — Telephone Encounter (Signed)
This encounter was created in error - please disregard.

## 2015-05-08 NOTE — Telephone Encounter (Signed)
2nd request.  Jonika Critz L, RN  

## 2015-06-05 ENCOUNTER — Other Ambulatory Visit: Payer: Self-pay | Admitting: Internal Medicine

## 2015-07-25 ENCOUNTER — Ambulatory Visit (INDEPENDENT_AMBULATORY_CARE_PROVIDER_SITE_OTHER): Payer: Medicare Other | Admitting: *Deleted

## 2015-07-25 DIAGNOSIS — Z23 Encounter for immunization: Secondary | ICD-10-CM | POA: Diagnosis present

## 2015-12-12 ENCOUNTER — Other Ambulatory Visit: Payer: Self-pay | Admitting: Internal Medicine

## 2015-12-13 NOTE — Telephone Encounter (Signed)
Patient needs another appointment for further refills.

## 2015-12-14 ENCOUNTER — Other Ambulatory Visit: Payer: Self-pay | Admitting: Family Medicine

## 2016-01-09 ENCOUNTER — Other Ambulatory Visit: Payer: Self-pay | Admitting: Internal Medicine

## 2016-02-06 ENCOUNTER — Other Ambulatory Visit: Payer: Self-pay | Admitting: Internal Medicine

## 2016-02-06 NOTE — Telephone Encounter (Signed)
Patient was given 30 day refill on 5/16 and instructed that she needs to see PCP. Patient has not been seen in clinic for over a year. I will be unable to provide a refill until she has an appointment scheduled.
